# Patient Record
Sex: Female | Born: 1965 | Race: White | Hispanic: No | Smoking: Former smoker
Health system: Southern US, Community
[De-identification: ages and names within clinical notes are randomized; demographics above are authoritative.]

## PROBLEM LIST (undated history)

## (undated) DIAGNOSIS — M51369 Other intervertebral disc degeneration, lumbar region without mention of lumbar back pain or lower extremity pain: Secondary | ICD-10-CM

## (undated) DIAGNOSIS — M5136 Other intervertebral disc degeneration, lumbar region: Secondary | ICD-10-CM

## (undated) DIAGNOSIS — I10 Essential (primary) hypertension: Secondary | ICD-10-CM

## (undated) HISTORY — DX: Essential (primary) hypertension: I10

## (undated) HISTORY — DX: Other intervertebral disc degeneration, lumbar region without mention of lumbar back pain or lower extremity pain: M51.369

## (undated) HISTORY — DX: Other intervertebral disc degeneration, lumbar region: M51.36

---

## 1998-05-20 ENCOUNTER — Other Ambulatory Visit: Admission: RE | Admit: 1998-05-20 | Discharge: 1998-05-20 | Payer: Self-pay | Admitting: Obstetrics & Gynecology

## 1999-10-29 ENCOUNTER — Other Ambulatory Visit: Admission: RE | Admit: 1999-10-29 | Discharge: 1999-10-29 | Payer: Self-pay | Admitting: Obstetrics & Gynecology

## 2001-03-15 ENCOUNTER — Other Ambulatory Visit: Admission: RE | Admit: 2001-03-15 | Discharge: 2001-03-15 | Payer: Self-pay | Admitting: Obstetrics & Gynecology

## 2002-08-07 ENCOUNTER — Other Ambulatory Visit: Admission: RE | Admit: 2002-08-07 | Discharge: 2002-08-07 | Payer: Self-pay | Admitting: Obstetrics & Gynecology

## 2003-09-04 ENCOUNTER — Other Ambulatory Visit: Admission: RE | Admit: 2003-09-04 | Discharge: 2003-09-04 | Payer: Self-pay | Admitting: Obstetrics & Gynecology

## 2004-09-08 ENCOUNTER — Other Ambulatory Visit: Admission: RE | Admit: 2004-09-08 | Discharge: 2004-09-08 | Payer: Self-pay | Admitting: Obstetrics & Gynecology

## 2005-11-04 ENCOUNTER — Other Ambulatory Visit: Admission: RE | Admit: 2005-11-04 | Discharge: 2005-11-04 | Payer: Self-pay | Admitting: Obstetrics & Gynecology

## 2008-07-11 ENCOUNTER — Emergency Department (HOSPITAL_COMMUNITY): Admission: EM | Admit: 2008-07-11 | Discharge: 2008-07-11 | Payer: Self-pay | Admitting: Emergency Medicine

## 2010-07-04 ENCOUNTER — Ambulatory Visit (HOSPITAL_COMMUNITY): Admission: RE | Admit: 2010-07-04 | Discharge: 2010-07-04 | Payer: Self-pay | Admitting: Obstetrics & Gynecology

## 2011-02-28 LAB — CBC
HCT: 34.1 % — ABNORMAL LOW (ref 36.0–46.0)
Hemoglobin: 11.1 g/dL — ABNORMAL LOW (ref 12.0–15.0)
MCH: 26.4 pg (ref 26.0–34.0)
MCHC: 32.7 g/dL (ref 30.0–36.0)
MCV: 80.9 fL (ref 78.0–100.0)
RBC: 4.22 MIL/uL (ref 3.87–5.11)

## 2013-04-17 ENCOUNTER — Telehealth: Payer: Self-pay | Admitting: Physician Assistant

## 2013-04-17 MED ORDER — LISINOPRIL 10 MG PO TABS: 10.0000 mg | ORAL_TABLET | Freq: Every day | ORAL | Status: DC

## 2013-04-17 NOTE — Telephone Encounter (Signed)
Medication refilled per protocol.Patient needs to be seen before any further refills 

## 2013-05-22 ENCOUNTER — Ambulatory Visit (INDEPENDENT_AMBULATORY_CARE_PROVIDER_SITE_OTHER): Payer: BC Managed Care – PPO | Admitting: Family Medicine

## 2013-05-22 ENCOUNTER — Encounter: Payer: Self-pay | Admitting: Family Medicine

## 2013-05-22 VITALS — BP 130/78 | HR 74 | Temp 98.2°F | Ht 64.5 in | Wt 136.0 lb

## 2013-05-22 DIAGNOSIS — I10 Essential (primary) hypertension: Secondary | ICD-10-CM

## 2013-05-22 LAB — COMPLETE METABOLIC PANEL WITH GFR
Albumin: 4.7 g/dL (ref 3.5–5.2)
BUN: 12 mg/dL (ref 6–23)
CO2: 26 mEq/L (ref 19–32)
GFR, Est African American: >89 mL/min
GFR, Est Non African American: 83 mL/min
Glucose, Bld: 108 mg/dL — ABNORMAL HIGH (ref 70–99)
Sodium: 141 mEq/L (ref 135–145)
Total Bilirubin: 0.3 mg/dL (ref 0.3–1.2)
Total Protein: 7.4 g/dL (ref 6.0–8.3)

## 2013-05-22 LAB — LIPID PANEL
Cholesterol: 182 mg/dL (ref 0–200)
HDL: 63 mg/dL (ref >39)

## 2013-05-22 MED ORDER — PHENTERMINE HCL 37.5 MG PO CAPS: 37.5000 mg | ORAL_CAPSULE | ORAL | Status: DC

## 2013-05-22 NOTE — Progress Notes (Signed)
  Subjective:    Patient ID: Melissa Spencer, female    DOB: [birth year], 47 y.o.   MRN: [id number]  HPI Patient is here today for followup of her hypertension. She is currently taking Lisinopril 10 mg by mouth daily.  She denies any chest pain, shortness of breath, dyspnea on exertion. She has successfully quit smoking. She denies any daily cough the blood pressure medication. She is due today to recheck her cholesterol. She is also interested in medications for weight loss. She's never tried any type of medication for this. She is currently watching her diet. She does exercise but not regularly. She gets her regular GYN care from Dr. Arlyce Dice. Past Medical History  Diagnosis Date  . Hypertension    Current Outpatient Prescriptions on File Prior to Visit  Medication Sig Dispense Refill  . lisinopril (PRINIVIL,ZESTRIL) 10 MG tablet Take 1 tablet (10 mg total) by mouth daily.  90 tablet  0   No current facility-administered medications on file prior to visit.   No Known Allergies History   Social History  . Marital Status: Single    Spouse Name: N/A    Number of Children: N/A  . Years of Education: N/A   Occupational History  . Not on file.   Social History Main Topics  . Smoking status: Former Games developer  . Smokeless tobacco: Not on file  . Alcohol Use: Yes     Comment: Occasional  . Drug Use: No  . Sexually Active: Not on file   Other Topics Concern  . Not on file   Social History Narrative  . No narrative on file      Review of Systems  All other systems reviewed and are negative.       Objective:   Physical Exam  Vitals reviewed. Constitutional: She appears well-developed and well-nourished.  Cardiovascular: Normal rate, regular rhythm, normal heart sounds and intact distal pulses.  Exam reveals no gallop and no friction rub.   No murmur heard. Pulmonary/Chest: Effort normal and breath sounds normal. No respiratory distress. She has no wheezes. She has no rales.   Abdominal: Soft. Bowel sounds are normal. She exhibits no distension. There is no tenderness. There is no rebound.          Assessment & Plan:  1. HTN (hypertension) Blood pressure is currently well controlled. Continue current medication at current dose. I will check a fasting lipid panel to assess her LDL cholesterol as well as her triglycerides and she has a history of elevated triglycerides in the past.  The rest of her preventative care is performed by her gynecologist.  Regards to weight loss, we had a long discussion about the risk and benefits of medication. She elects to try and care mean 37.5 mg by mouth every morning.  I gave her 30 pills with 3 refills. We discussed cardiovascular risk with this medication. Also recommended increasing aerobic exercise to 30 minutes a day 5 days a week. - COMPLETE METABOLIC PANEL WITH GFR - Lipid panel

## 2013-05-30 ENCOUNTER — Other Ambulatory Visit (INDEPENDENT_AMBULATORY_CARE_PROVIDER_SITE_OTHER): Payer: BC Managed Care – PPO

## 2013-05-30 DIAGNOSIS — E875 Hyperkalemia: Secondary | ICD-10-CM

## 2013-05-31 LAB — BASIC METABOLIC PANEL
BUN: 9 mg/dL (ref 6–23)
CO2: 25 mEq/L (ref 19–32)
Calcium: 10.2 mg/dL (ref 8.4–10.5)
Glucose, Bld: 92 mg/dL (ref 70–99)
Potassium: 4.1 mEq/L (ref 3.5–5.3)
Sodium: 137 mEq/L (ref 135–145)

## 2013-06-01 ENCOUNTER — Other Ambulatory Visit: Payer: Self-pay | Admitting: Family Medicine

## 2013-06-01 MED ORDER — AMLODIPINE BESYLATE 5 MG PO TABS: 5.0000 mg | ORAL_TABLET | Freq: Every day | ORAL | Status: DC

## 2013-06-01 NOTE — Telephone Encounter (Signed)
Rx filled

## 2013-06-30 ENCOUNTER — Ambulatory Visit (INDEPENDENT_AMBULATORY_CARE_PROVIDER_SITE_OTHER): Payer: BC Managed Care – PPO | Admitting: Family Medicine

## 2013-06-30 ENCOUNTER — Encounter: Payer: Self-pay | Admitting: Family Medicine

## 2013-06-30 VITALS — BP 120/80 | HR 90 | Temp 98.0°F | Resp 18 | Wt 130.0 lb

## 2013-06-30 DIAGNOSIS — N390 Urinary tract infection, site not specified: Secondary | ICD-10-CM

## 2013-06-30 DIAGNOSIS — I10 Essential (primary) hypertension: Secondary | ICD-10-CM

## 2013-06-30 LAB — URINALYSIS, MICROSCOPIC ONLY: Crystals: NONE SEEN

## 2013-06-30 LAB — URINALYSIS, ROUTINE W REFLEX MICROSCOPIC
Bilirubin Urine: NEGATIVE
Leukocytes, UA: NEGATIVE
Nitrite: NEGATIVE
Specific Gravity, Urine: >1.03 — ABNORMAL HIGH (ref 1.005–1.030)
Urobilinogen, UA: 0.2 mg/dL (ref 0.0–1.0)

## 2013-06-30 MED ORDER — CIPROFLOXACIN HCL 500 MG PO TABS: 500.0000 mg | ORAL_TABLET | Freq: Two times a day (BID) | ORAL | Status: DC

## 2013-06-30 NOTE — Progress Notes (Signed)
Subjective:    Patient ID: Melissa Spencer, female    DOB: [birth year], 47 y.o.   MRN: [id number]  HPI 05/22/13 Patient is here today for followup of her hypertension. She is currently taking Lisinopril 10 mg by mouth daily.  She denies any chest pain, shortness of breath, dyspnea on exertion. She has successfully quit smoking. She denies any daily cough the blood pressure medication. She is due today to recheck her cholesterol. She is also interested in medications for weight loss. She's never tried any type of medication for this. She is currently watching her diet. She does exercise but not regularly. She gets her regular GYN care from Dr. Arlyce Dice.  At that time, my plan was: 1. HTN (hypertension) Blood pressure is currently well controlled. Continue current medication at current dose. I will check a fasting lipid panel to assess her LDL cholesterol as well as her triglycerides and she has a history of elevated triglycerides in the past.  The rest of her preventative care is performed by her gynecologist.  Regards to weight loss, we had a long discussion about the risk and benefits of medication. She elects to try and care mean 37.5 mg by mouth every morning.  I gave her 30 pills with 3 refills. We discussed cardiovascular risk with this medication. Also recommended increasing aerobic exercise to 30 minutes a day 5 days a week. - COMPLETE METABOLIC PANEL WITH GFR - Lipid panel Lab on 05/30/2013  Component Date Value Range Status  . Sodium 05/30/2013 137  135 - 145 mEq/L Final  . Potassium 05/30/2013 4.1  3.5 - 5.3 mEq/L Final  . Chloride 05/30/2013 100  96 - 112 mEq/L Final  . CO2 05/30/2013 25  19 - 32 mEq/L Final  . Glucose, Bld 05/30/2013 92  70 - 99 mg/dL Final  . BUN 40/98/1191 9  6 - 23 mg/dL Final  . Creat 47/82/9562 0.78  0.50 - 1.10 mg/dL Final  . Calcium 13/07/6577 10.2  8.4 - 10.5 mg/dL Final  Office Visit on 05/22/2013  Component Date Value Range Status  . Sodium 05/22/2013 141  135  - 145 mEq/L Final  . Potassium 05/22/2013 5.8* 3.5 - 5.3 mEq/L Final   No visible hemolysis.  . Chloride 05/22/2013 106  96 - 112 mEq/L Final  . CO2 05/22/2013 26  19 - 32 mEq/L Final  . Glucose, Bld 05/22/2013 108* 70 - 99 mg/dL Final  . BUN 46/96/2952 12  6 - 23 mg/dL Final  . Creat 84/13/2440 0.84  0.50 - 1.10 mg/dL Final  . Total Bilirubin 05/22/2013 0.3  0.3 - 1.2 mg/dL Final  . Alkaline Phosphatase 05/22/2013 64  39 - 117 U/L Final  . AST 05/22/2013 13  0 - 37 U/L Final  . ALT 05/22/2013 13  0 - 35 U/L Final  . Total Protein 05/22/2013 7.4  6.0 - 8.3 g/dL Final  . Albumin 10/10/2535 4.7  3.5 - 5.2 g/dL Final  . Calcium 64/40/3474 9.9  8.4 - 10.5 mg/dL Final  . GFR, Est African American 05/22/2013 >89   Final  . GFR, Est Non African American 05/22/2013 83   Final   Comment:                            The estimated GFR is a calculation valid for adults (>=4 years old)  that uses the CKD-EPI algorithm to adjust for age and sex. It is                            not to be used for children, pregnant women, hospitalized patients,                             patients on dialysis, or with rapidly changing kidney function.                          According to the NKDEP, eGFR >89 is normal, 60-89 shows mild                          impairment, 30-59 shows moderate impairment, 15-29 shows severe                          impairment and <15 is ESRD.                             Marland Kitchen Cholesterol 05/22/2013 182  0 - 200 mg/dL Final   Comment: ATP III Classification:                                < 200        mg/dL        Desirable                               200 - 239     mg/dL        Borderline High                               >= 240        mg/dL        High                             . Triglycerides 05/22/2013 188* <150 mg/dL Final  . HDL 16/09/9603 63  >39 mg/dL Final  . Total CHOL/HDL Ratio 05/22/2013 2.9   Final  . VLDL 05/22/2013 38  0 - 40 mg/dL Final  .  LDL Cholesterol 05/22/2013 81  0 - 99 mg/dL Final   Comment:                            Total Cholesterol/HDL Ratio:CHD Risk                                                 Coronary Heart Disease Risk Table  Men       Women                                   1/2 Average Risk              3.4        3.3                                       Average Risk              5.0        4.4                                    2X Average Risk              9.6        7.1                                    3X Average Risk             23.4       11.0                          Use the calculated Patient Ratio above and the CHD Risk table                           to determine the patient's CHD Risk.                          ATP III Classification (LDL):                                < 100        mg/dL         Optimal                               100 - 129     mg/dL         Near or Above Optimal                               130 - 159     mg/dL         Borderline High                               160 - 189     mg/dL         High                                > 190        mg/dL         Very High  Due to elevated potassium, lisinopril was discontinued and amlodipine 5 mg poqday was substituted.  The patient is here today for recheck.  Patient thinks she may have a UTI.  She reports 2 weeks of urinary frequency and bladder pressure. She states when she bleeds sometimes she voids very little and when she stand up she felt like she has to go immediately again.  She denies dysuria. She has noticed trace hematuria.  Her blood pressure has been well controlled on amlodipine. She denies any swelling in her ankles.  She only has one more month of amine and then she plans to quit. I want her that her BMI is approaching the lower limits of normal. Past Medical History  Diagnosis Date  . Hypertension    Current Outpatient  Prescriptions on File Prior to Visit  Medication Sig Dispense Refill  . amLODipine (NORVASC) 5 MG tablet Take 1 tablet (5 mg total) by mouth daily.  30 tablet  3  . phentermine 37.5 MG capsule Take 1 capsule (37.5 mg total) by mouth every morning.  30 capsule  2   No current facility-administered medications on file prior to visit.   No Known Allergies History   Social History  . Marital Status: Single    Spouse Name: N/A    Number of Children: N/A  . Years of Education: N/A   Occupational History  . Not on file.   Social History Main Topics  . Smoking status: Former Games developer  . Smokeless tobacco: Not on file  . Alcohol Use: Yes     Comment: Occasional  . Drug Use: No  . Sexually Active: Not on file   Other Topics Concern  . Not on file   Social History Narrative  . No narrative on file      Review of Systems  All other systems reviewed and are negative.       Objective:   Physical Exam  Vitals reviewed. Constitutional: She appears well-developed and well-nourished.  Cardiovascular: Normal rate, regular rhythm, normal heart sounds and intact distal pulses.  Exam reveals no gallop and no friction rub.   No murmur heard. Pulmonary/Chest: Effort normal and breath sounds normal. No respiratory distress. She has no wheezes. She has no rales.  Abdominal: Soft. Bowel sounds are normal. She exhibits no distension. There is no tenderness. There is no rebound.          Assessment & Plan:  1. UTI (urinary tract infection) Symptoms consistent with a urinary tract infection. Begin Cipro 500 mg by mouth twice a day for 5 days. - Urinalysis, Routine w reflex microscopic  2. HTN (hypertension) Blood pressures well controlled. Continue amlodipine 5 mg by mouth daily. Recheck in one year.

## 2013-06-30 NOTE — Addendum Note (Signed)
Addended by: WRAY, [place] on: 06/30/2013 09:48 AM   Modules accepted: Orders

## 2013-07-01 LAB — URINE CULTURE: Organism ID, Bacteria: NO GROWTH

## 2013-07-17 ENCOUNTER — Other Ambulatory Visit: Payer: BC Managed Care – PPO

## 2013-07-17 DIAGNOSIS — R3129 Other microscopic hematuria: Secondary | ICD-10-CM

## 2013-07-17 LAB — URINALYSIS, MICROSCOPIC ONLY: Casts: NONE SEEN

## 2013-07-17 LAB — URINALYSIS, ROUTINE W REFLEX MICROSCOPIC
Glucose, UA: NEGATIVE mg/dL
Leukocytes, UA: NEGATIVE
Nitrite: NEGATIVE
Specific Gravity, Urine: >1.03 — ABNORMAL HIGH (ref 1.005–1.030)
pH: 5.5 (ref 5.0–8.0)

## 2013-07-24 ENCOUNTER — Telehealth: Payer: Self-pay | Admitting: Family Medicine

## 2013-07-25 NOTE — Telephone Encounter (Signed)
lmtrc

## 2013-07-25 NOTE — Telephone Encounter (Signed)
Questions answered.

## 2013-07-28 ENCOUNTER — Encounter: Payer: Self-pay | Admitting: Family Medicine

## 2013-07-28 ENCOUNTER — Ambulatory Visit (INDEPENDENT_AMBULATORY_CARE_PROVIDER_SITE_OTHER): Payer: BC Managed Care – PPO | Admitting: Family Medicine

## 2013-07-28 VITALS — BP 128/82 | HR 78 | Temp 98.2°F | Resp 16 | Wt 128.0 lb

## 2013-07-28 DIAGNOSIS — R319 Hematuria, unspecified: Secondary | ICD-10-CM

## 2013-07-28 LAB — URINALYSIS, ROUTINE W REFLEX MICROSCOPIC
Bilirubin Urine: NEGATIVE
Glucose, UA: NEGATIVE mg/dL
Hgb urine dipstick: NEGATIVE
Leukocytes, UA: NEGATIVE
Protein, ur: NEGATIVE mg/dL
pH: 5.5 (ref 5.0–8.0)

## 2013-07-28 NOTE — Progress Notes (Signed)
Subjective:    Patient ID: Melissa Spencer, female    DOB: [birth year], 47 y.o.   MRN: [id number]  HPI Patient was initially seen July 18 with symptoms consistent with a urinary tract infection. She was found to have only microscopic hematuria on a urinalysis. Urine culture was negative. The symptoms did not improve on Cipro. A repeat urinalysis revealed again trace hematuria. She returns today. Although her urinalysis is clear she continues to complain of low back pain and pelvic pressure. She also complains of frequency and urgency. She is here to discuss the next step in the work up. Past Medical History  Diagnosis Date  . Hypertension    Current Outpatient Prescriptions on File Prior to Visit  Medication Sig Dispense Refill  . amLODipine (NORVASC) 5 MG tablet Take 1 tablet (5 mg total) by mouth daily.  30 tablet  3  . phentermine 37.5 MG capsule Take 1 capsule (37.5 mg total) by mouth every morning.  30 capsule  2   No current facility-administered medications on file prior to visit.   No Known Allergies History   Social History  . Marital Status: Single    Spouse Name: N/A    Number of Children: N/A  . Years of Education: N/A   Occupational History  . Not on file.   Social History Main Topics  . Smoking status: Former Games developer  . Smokeless tobacco: Not on file  . Alcohol Use: Yes     Comment: Occasional  . Drug Use: No  . Sexual Activity: Not on file   Other Topics Concern  . Not on file   Social History Narrative  . No narrative on file      Review of Systems  All other systems reviewed and are negative.       Objective:   Physical Exam  Vitals reviewed. Cardiovascular: Normal rate, regular rhythm and normal heart sounds.   No murmur heard. Pulmonary/Chest: Effort normal and breath sounds normal. No respiratory distress. She has no wheezes. She has no rales.  Abdominal: Soft. Bowel sounds are normal. She exhibits no distension and no mass. There is no  tenderness. There is no rebound and no guarding.   no CVAT  Office Visit on 07/28/2013  Component Date Value Range Status  . Color, Urine 07/28/2013 YELLOW  YELLOW Final  . APPearance 07/28/2013 CLEAR  CLEAR Final  . Specific Gravity, Urine 07/28/2013 >1.030* 1.005 - 1.030 Final  . pH 07/28/2013 5.5  5.0 - 8.0 Final  . Glucose, UA 07/28/2013 NEG  NEG mg/dL Final  . Bilirubin Urine 07/28/2013 NEG  NEG Final  . Ketones, ur 07/28/2013 NEG  NEG mg/dL Final  . Hgb urine dipstick 07/28/2013 NEG  NEG Final  . Protein, ur 07/28/2013 NEG  NEG mg/dL Final  . Urobilinogen, UA 07/28/2013 0.2  0.0 - 1.0 mg/dL Final  . Nitrite 40/98/1191 NEG  NEG Final  . Leukocytes, UA 07/28/2013 NEG  NEG Final         Assessment & Plan:  1. Hematuria Patient like to proceed with a CT scan of the abdomen and pelvis with contrast to evaluate for causes of hematuria. I feel the patient most likely had a small kidney stone that has subsequently passed. I feel her symptoms may be residual inflammation and swelling due to the passage of a kidney stone. I will obtain a CT scan to evaluate for any retained stones, rule out renal cyst or renal neoplasms. If symptoms persist consider a cystoscopy  through urology. - Urinalysis, Routine w reflex microscopic - CT Abdomen Pelvis W Contrast; Future

## 2013-08-01 ENCOUNTER — Other Ambulatory Visit: Payer: Self-pay | Admitting: Family Medicine

## 2013-08-01 DIAGNOSIS — R319 Hematuria, unspecified: Secondary | ICD-10-CM

## 2013-08-02 ENCOUNTER — Ambulatory Visit
Admission: RE | Admit: 2013-08-02 | Discharge: 2013-08-02 | Disposition: A | Discharge status: Home / Self Care | Payer: BC Managed Care – PPO | Source: Ambulatory Visit | Attending: Family Medicine | Admitting: Family Medicine

## 2013-08-02 DIAGNOSIS — R319 Hematuria, unspecified: Secondary | ICD-10-CM

## 2013-08-02 MED ORDER — IOHEXOL 300 MG/ML  SOLN
125.0000 mL | Freq: Once | INTRAMUSCULAR | Status: AC | PRN
  Administered 2013-08-02: 125 mL via INTRAVENOUS

## 2013-08-03 ENCOUNTER — Other Ambulatory Visit: Payer: Self-pay | Admitting: Family Medicine

## 2013-08-03 DIAGNOSIS — N852 Hypertrophy of uterus: Secondary | ICD-10-CM

## 2013-08-04 ENCOUNTER — Ambulatory Visit
Admission: RE | Admit: 2013-08-04 | Discharge: 2013-08-04 | Disposition: A | Discharge status: Home / Self Care | Payer: BC Managed Care – PPO | Source: Ambulatory Visit | Attending: Family Medicine | Admitting: Family Medicine

## 2013-08-04 ENCOUNTER — Telehealth: Payer: Self-pay | Admitting: Family Medicine

## 2013-08-04 ENCOUNTER — Other Ambulatory Visit: Payer: Self-pay | Admitting: Family Medicine

## 2013-08-04 DIAGNOSIS — N852 Hypertrophy of uterus: Secondary | ICD-10-CM

## 2013-08-04 NOTE — Telephone Encounter (Signed)
Does not think she is pregnant.  Can't understand why all the back pain and abd pain???   And the diarrhea?  Could it be caused by this fibroid?? States has pain every time she eats??

## 2013-08-04 NOTE — Telephone Encounter (Signed)
Message copied by Donne Anon on Fri Aug 04, 2013  5:43 PM ------      Message from: Lynnea Ferrier      Created: Fri Aug 04, 2013  1:51 PM       Good news, the [place] confirms fibroids.  There is a small cyst which is nonspecific in the uterus.  Is there any chance she could be pregnant?  If not, it is nothing dangerous but sometimes early pregnancies can look like that. ------

## 2013-08-07 NOTE — Telephone Encounter (Signed)
Patient aware.

## 2013-08-07 NOTE — Telephone Encounter (Signed)
Fibroids could cause lower abd pressure.  Hematuria has resolved and there were no abnormalities seen that would cause this.  She likely passed a small kidney stone.    Back pain is likely muscle strain in back.  Diarrhea is new complaint.  I honestly don't remember this.  It may be from Abx.  Try imodium prior to big meals and see if this stops diarrhea.

## 2013-09-26 ENCOUNTER — Other Ambulatory Visit: Payer: Self-pay | Admitting: Family Medicine

## 2014-01-22 ENCOUNTER — Other Ambulatory Visit: Payer: 59

## 2014-01-22 DIAGNOSIS — Z0000 Encounter for general adult medical examination without abnormal findings: Secondary | ICD-10-CM

## 2014-01-22 LAB — CBC WITH DIFFERENTIAL/PLATELET
BASOS PCT: 0 % (ref 0–1)
Basophils Absolute: 0 10*3/uL (ref 0.0–0.1)
EOS ABS: 0.1 10*3/uL (ref 0.0–0.7)
Eosinophils Relative: 1 % (ref 0–5)
HCT: 39.7 % (ref 36.0–46.0)
Hemoglobin: 13.4 g/dL (ref 12.0–15.0)
LYMPHS ABS: 2.7 10*3/uL (ref 0.7–4.0)
Lymphocytes Relative: 31 % (ref 12–46)
MCH: 30.4 pg (ref 26.0–34.0)
MCHC: 33.8 g/dL (ref 30.0–36.0)
MCV: 90 fL (ref 78.0–100.0)
Monocytes Absolute: 0.5 10*3/uL (ref 0.1–1.0)
Monocytes Relative: 6 % (ref 3–12)
NEUTROS PCT: 62 % (ref 43–77)
Neutro Abs: 5.4 10*3/uL (ref 1.7–7.7)
PLATELETS: 413 10*3/uL — AB (ref 150–400)
RBC: 4.41 MIL/uL (ref 3.87–5.11)
RDW: 12.9 % (ref 11.5–15.5)
WBC: 8.7 10*3/uL (ref 4.0–10.5)

## 2014-01-22 LAB — COMPREHENSIVE METABOLIC PANEL
ALK PHOS: 62 U/L (ref 39–117)
ALT: 18 U/L (ref 0–35)
AST: 15 U/L (ref 0–37)
Albumin: 4.5 g/dL (ref 3.5–5.2)
BILIRUBIN TOTAL: 0.5 mg/dL (ref 0.2–1.2)
BUN: 9 mg/dL (ref 6–23)
CO2: 29 mEq/L (ref 19–32)
Calcium: 9.8 mg/dL (ref 8.4–10.5)
Chloride: 101 mEq/L (ref 96–112)
Creat: 0.68 mg/dL (ref 0.50–1.10)
Glucose, Bld: 94 mg/dL (ref 70–99)
Potassium: 4.9 mEq/L (ref 3.5–5.3)
SODIUM: 138 meq/L (ref 135–145)
TOTAL PROTEIN: 7.2 g/dL (ref 6.0–8.3)

## 2014-01-22 LAB — TSH: TSH: 1.236 u[IU]/mL (ref 0.350–4.500)

## 2014-01-22 LAB — LIPID PANEL
Cholesterol: 190 mg/dL (ref 0–200)
HDL: 68 mg/dL (ref >39)
LDL CALC: 86 mg/dL (ref 0–99)
Total CHOL/HDL Ratio: 2.8 Ratio
Triglycerides: 179 mg/dL — ABNORMAL HIGH (ref <150)
VLDL: 36 mg/dL (ref 0–40)

## 2014-01-29 ENCOUNTER — Encounter: Payer: Self-pay | Admitting: Family Medicine

## 2014-01-29 ENCOUNTER — Ambulatory Visit (INDEPENDENT_AMBULATORY_CARE_PROVIDER_SITE_OTHER): Payer: 59 | Admitting: Family Medicine

## 2014-01-29 VITALS — BP 140/70 | HR 78 | Temp 97.8°F | Resp 18 | Ht 64.0 in | Wt 133.0 lb

## 2014-01-29 DIAGNOSIS — M5136 Other intervertebral disc degeneration, lumbar region: Secondary | ICD-10-CM | POA: Insufficient documentation

## 2014-01-29 DIAGNOSIS — Z0000 Encounter for general adult medical examination without abnormal findings: Secondary | ICD-10-CM

## 2014-01-29 NOTE — Progress Notes (Signed)
Subjective:    Patient ID: Melissa Spencer, female    DOB: 1966-05-02, 48 y.o.   MRN: [id number]  HPI Is a very pleasant 48 year old white female who presents today for complete physical exam. She sees Dr. Deatra Ina who provides her GYN care. She is scheduled to have a Pap smear and mammogram performed there later this year. She has no complaints today. He is doing well. Her blood pressure is borderline elevated today 140/70. Her most recent lab work is listed below: No visits with results within 1 Week(s) from this visit. Latest known visit with results is:  Lab on 01/22/2014  Component Date Value Ref Range Status  . WBC 01/22/2014 8.7  4.0 - 10.5 K/uL Final  . RBC 01/22/2014 4.41  3.87 - 5.11 MIL/uL Final  . Hemoglobin 01/22/2014 13.4  12.0 - 15.0 g/dL Final  . HCT 01/22/2014 39.7  36.0 - 46.0 % Final  . MCV 01/22/2014 90.0  78.0 - 100.0 fL Final  . MCH 01/22/2014 30.4  26.0 - 34.0 pg Final  . MCHC 01/22/2014 33.8  30.0 - 36.0 g/dL Final  . RDW 01/22/2014 12.9  11.5 - 15.5 % Final  . Platelets 01/22/2014 413* 150 - 400 K/uL Final  . Neutrophils Relative % 01/22/2014 62  43 - 77 % Final  . Neutro Abs 01/22/2014 5.4  1.7 - 7.7 K/uL Final  . Lymphocytes Relative 01/22/2014 31  12 - 46 % Final  . Lymphs Abs 01/22/2014 2.7  0.7 - 4.0 K/uL Final  . Monocytes Relative 01/22/2014 6  3 - 12 % Final  . Monocytes Absolute 01/22/2014 0.5  0.1 - 1.0 K/uL Final  . Eosinophils Relative 01/22/2014 1  0 - 5 % Final  . Eosinophils Absolute 01/22/2014 0.1  0.0 - 0.7 K/uL Final  . Basophils Relative 01/22/2014 0  0 - 1 % Final  . Basophils Absolute 01/22/2014 0.0  0.0 - 0.1 K/uL Final  . Smear Review 01/22/2014 Criteria for review not met   Final  . Sodium 01/22/2014 138  135 - 145 mEq/L Final  . Potassium 01/22/2014 4.9  3.5 - 5.3 mEq/L Final  . Chloride 01/22/2014 101  96 - 112 mEq/L Final  . CO2 01/22/2014 29  19 - 32 mEq/L Final  . Glucose, Bld 01/22/2014 94  70 - 99 mg/dL Final  . BUN 01/22/2014  9  6 - 23 mg/dL Final  . Creat 01/22/2014 0.68  0.50 - 1.10 mg/dL Final  . Total Bilirubin 01/22/2014 0.5  0.2 - 1.2 mg/dL Final   ** Please note change in reference range(s). **  . Alkaline Phosphatase 01/22/2014 62  39 - 117 U/L Final  . AST 01/22/2014 15  0 - 37 U/L Final  . ALT 01/22/2014 18  0 - 35 U/L Final  . Total Protein 01/22/2014 7.2  6.0 - 8.3 g/dL Final  . Albumin 01/22/2014 4.5  3.5 - 5.2 g/dL Final  . Calcium 01/22/2014 9.8  8.4 - 10.5 mg/dL Final  . Cholesterol 01/22/2014 190  0 - 200 mg/dL Final   Comment: ATP III Classification:                                < 200        mg/dL        Desirable  200 - 239     mg/dL        Borderline High                               >= 240        mg/dL        High                             . Triglycerides 01/22/2014 179* <150 mg/dL Final  . HDL 01/22/2014 68  >39 mg/dL Final  . Total CHOL/HDL Ratio 01/22/2014 2.8   Final  . VLDL 01/22/2014 36  0 - 40 mg/dL Final  . LDL Cholesterol 01/22/2014 86  0 - 99 mg/dL Final   Comment:                            Total Cholesterol/HDL Ratio:CHD Risk                                                 Coronary Heart Disease Risk Table                                                                 Men       Women                                   1/2 Average Risk              3.4        3.3                                       Average Risk              5.0        4.4                                    2X Average Risk              9.6        7.1                                    3X Average Risk             23.4       11.0                          Use the calculated Patient Ratio above and the CHD Risk table  to determine the patient's CHD Risk.                          ATP III Classification (LDL):                                < 100        mg/dL         Optimal                               100 - 129     mg/dL         Near or Above Optimal                                 130 - 159     mg/dL         Borderline High                               160 - 189     mg/dL         High                                > 190        mg/dL         Very High                             . TSH 01/22/2014 1.236  0.350 - 4.500 uIU/mL Final   Past Medical History  Diagnosis Date  . Hypertension   . 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 amLODipine (NORVASC) 5 MG tablet TAKE 1 TABLET (5 MG TOTAL) BY MOUTH DAILY.  30 tablet  3   No current facility-administered medications on file prior to visit.   No past surgical history on file. No Known Allergies History   Social History  . Marital Status: Single    Spouse Name: N/A    Number of Children: N/A  . Years of Education: N/A   Occupational History  . Not on file.   Social History Main Topics  . Smoking status: Former Research scientist (life sciences)  . Smokeless tobacco: Not on file  . Alcohol Use: Yes     Comment: Occasional  . Drug Use: No  . 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 Not on file   Social History Narrative  . No narrative on file   Family History  Problem Relation Age of Onset  . Hyperlipidemia Mother   . Hypertension Mother   . Hyperlipidemia Father   . Heart disease Father       Review of Systems  All other systems reviewed and are negative.       Objective:   Physical Exam  Vitals reviewed. Constitutional: She is oriented to person, place, and time. She appears well-developed and well-nourished. No distress.  HENT:  Head: Normocephalic  and atraumatic.  Right Ear: External ear normal.  Left Ear: External ear normal.  Nose: Nose normal.  Mouth/Throat: Oropharynx is clear and moist. No oropharyngeal exudate.  Eyes: Conjunctivae and EOM are normal. Pupils are equal, round, and  reactive to light. Right eye exhibits no discharge. Left eye exhibits no discharge. No scleral icterus.  Neck: Normal range of motion. Neck supple. No JVD present. No tracheal deviation present. No thyromegaly present.  Cardiovascular: Normal rate, regular rhythm, normal heart sounds and intact distal pulses.  Exam reveals no gallop and no friction rub.   No murmur heard. Pulmonary/Chest: Effort normal and breath sounds normal. No stridor. No respiratory distress. She has no wheezes. She has no rales. She exhibits no tenderness.  Abdominal: Soft. Bowel sounds are normal. She exhibits no distension and no mass. There is no tenderness. There is no rebound and no guarding.  Musculoskeletal: Normal range of motion. She exhibits no edema and no tenderness.  Lymphadenopathy:    She has no cervical adenopathy.  Neurological: She is alert and oriented to person, place, and time. She has normal reflexes. No cranial nerve deficit. Coordination normal.  Skin: Skin is warm. No rash noted. She is not diaphoretic. No erythema. No pallor.  Psychiatric: She has a normal mood and affect. Her behavior is normal. Judgment and thought content normal.   patient has a tattoo on the posterior right shoulder and on her right wrist.        Assessment & Plan:  1. Routine general medical examination at a health care facility Physical exam is completely normal except her blood pressure which is slightly elevated 140/70. Patient will check her blood pressure every day for the next 2 weeks and then bring me the values. Her blood pressures consistently higher than 140/90 I would recommend adding hydrochlorothiazide to the amlodipine that she is already taking. Patient declines a flu shot. Her tetanus shot  (TdaP)was given in 2009.  The remainder of her cancer screening will be performed her gynecologist this year.

## 2014-02-05 ENCOUNTER — Other Ambulatory Visit: Payer: Self-pay | Admitting: Family Medicine

## 2014-02-26 ENCOUNTER — Other Ambulatory Visit: Payer: Self-pay | Admitting: Obstetrics & Gynecology

## 2014-02-26 DIAGNOSIS — R928 Other abnormal and inconclusive findings on diagnostic imaging of breast: Secondary | ICD-10-CM

## 2014-03-02 ENCOUNTER — Ambulatory Visit
Admission: RE | Admit: 2014-03-02 | Discharge: 2014-03-02 | Disposition: A | Discharge status: Home / Self Care | Payer: Self-pay | Source: Ambulatory Visit | Attending: Obstetrics & Gynecology | Admitting: Obstetrics & Gynecology

## 2014-03-02 ENCOUNTER — Ambulatory Visit
Admission: RE | Admit: 2014-03-02 | Discharge: 2014-03-02 | Disposition: A | Discharge status: Home / Self Care | Payer: PRIVATE HEALTH INSURANCE | Source: Ambulatory Visit | Attending: Obstetrics & Gynecology | Admitting: Obstetrics & Gynecology

## 2014-03-02 DIAGNOSIS — R928 Other abnormal and inconclusive findings on diagnostic imaging of breast: Secondary | ICD-10-CM

## 2014-03-09 ENCOUNTER — Telehealth: Payer: Self-pay | Admitting: Family Medicine

## 2014-03-09 DIAGNOSIS — I10 Essential (primary) hypertension: Secondary | ICD-10-CM

## 2014-03-09 MED ORDER — HYDROCHLOROTHIAZIDE 12.5 MG PO CAPS: 12.5000 mg | ORAL_CAPSULE | Freq: Every day | ORAL | Status: DC

## 2014-03-09 NOTE — Telephone Encounter (Signed)
Dr Tanya NonesPickard reviewed BP readings left by patient.  Patient told, per provider, that readings are borderline most days and high on the other days.  Provider wants to start patient on HCTZ 12.5 mg QD.  Told patient to return to office in one month for nurse visit BP check.  She acknowledged understanding.

## 2014-04-10 ENCOUNTER — Other Ambulatory Visit: Payer: Self-pay | Admitting: Family Medicine

## 2014-05-06 ENCOUNTER — Other Ambulatory Visit: Payer: Self-pay | Admitting: Family Medicine

## 2014-11-08 ENCOUNTER — Other Ambulatory Visit: Payer: Self-pay | Admitting: Family Medicine

## 2014-11-22 ENCOUNTER — Other Ambulatory Visit: Payer: Self-pay | Admitting: *Deleted

## 2014-11-22 MED ORDER — HYDROCHLOROTHIAZIDE 12.5 MG PO CAPS: ORAL_CAPSULE | ORAL | Status: DC

## 2014-11-22 NOTE — Telephone Encounter (Signed)
Received fax requesting refill on HCTZ.  Medication filled x1 with no refills.   Requires office visit before any further refills can be given.   Letter sent.  

## 2014-12-25 ENCOUNTER — Other Ambulatory Visit: Payer: PRIVATE HEALTH INSURANCE

## 2014-12-25 DIAGNOSIS — Z79899 Other long term (current) drug therapy: Secondary | ICD-10-CM

## 2014-12-25 DIAGNOSIS — I10 Essential (primary) hypertension: Secondary | ICD-10-CM

## 2014-12-25 LAB — COMPLETE METABOLIC PANEL WITH GFR
ALK PHOS: 61 U/L (ref 39–117)
ALT: 14 U/L (ref 0–35)
AST: 14 U/L (ref 0–37)
Albumin: 4.2 g/dL (ref 3.5–5.2)
BILIRUBIN TOTAL: 0.4 mg/dL (ref 0.2–1.2)
BUN: 14 mg/dL (ref 6–23)
CALCIUM: 9.7 mg/dL (ref 8.4–10.5)
CO2: 26 meq/L (ref 19–32)
CREATININE: 0.64 mg/dL (ref 0.50–1.10)
Chloride: 102 mEq/L (ref 96–112)
GFR, Est African American: >89 mL/min
GFR, Est Non African American: >89 mL/min
Glucose, Bld: 97 mg/dL (ref 70–99)
POTASSIUM: 5 meq/L (ref 3.5–5.3)
SODIUM: 140 meq/L (ref 135–145)
Total Protein: 6.8 g/dL (ref 6.0–8.3)

## 2014-12-25 LAB — LIPID PANEL
CHOLESTEROL: 191 mg/dL (ref 0–200)
HDL: 67 mg/dL (ref >39)
LDL CALC: 97 mg/dL (ref 0–99)
TRIGLYCERIDES: 137 mg/dL (ref <150)
Total CHOL/HDL Ratio: 2.9 Ratio
VLDL: 27 mg/dL (ref 0–40)

## 2014-12-25 LAB — CBC WITH DIFFERENTIAL/PLATELET
Basophils Absolute: 0 10*3/uL (ref 0.0–0.1)
Basophils Relative: 0 % (ref 0–1)
EOS ABS: 0.2 10*3/uL (ref 0.0–0.7)
EOS PCT: 2 % (ref 0–5)
HEMATOCRIT: 39.1 % (ref 36.0–46.0)
HEMOGLOBIN: 13 g/dL (ref 12.0–15.0)
LYMPHS ABS: 2.6 10*3/uL (ref 0.7–4.0)
LYMPHS PCT: 32 % (ref 12–46)
MCH: 30.7 pg (ref 26.0–34.0)
MCHC: 33.2 g/dL (ref 30.0–36.0)
MCV: 92.2 fL (ref 78.0–100.0)
MONO ABS: 0.6 10*3/uL (ref 0.1–1.0)
MONOS PCT: 8 % (ref 3–12)
MPV: 9.8 fL (ref 8.6–12.4)
NEUTROS ABS: 4.6 10*3/uL (ref 1.7–7.7)
NEUTROS PCT: 58 % (ref 43–77)
Platelets: 399 10*3/uL (ref 150–400)
RBC: 4.24 MIL/uL (ref 3.87–5.11)
RDW: 13.4 % (ref 11.5–15.5)
WBC: 8 10*3/uL (ref 4.0–10.5)

## 2015-01-01 ENCOUNTER — Ambulatory Visit (INDEPENDENT_AMBULATORY_CARE_PROVIDER_SITE_OTHER): Payer: PRIVATE HEALTH INSURANCE | Admitting: Family Medicine

## 2015-01-01 ENCOUNTER — Encounter: Payer: Self-pay | Admitting: Family Medicine

## 2015-01-01 VITALS — BP 132/80 | HR 76 | Temp 98.2°F | Resp 18 | Wt 130.0 lb

## 2015-01-01 DIAGNOSIS — I10 Essential (primary) hypertension: Secondary | ICD-10-CM

## 2015-01-01 DIAGNOSIS — M7711 Lateral epicondylitis, right elbow: Secondary | ICD-10-CM

## 2015-01-01 NOTE — Progress Notes (Signed)
Subjective:    Patient ID: Melissa Spencer, female    DOB: October 23, 1966, 49 y.o.   MRN: [id number]  HPI Patient is a very pleasant 49 year old white female who is here today for follow-up of her hypertension. She denies any chest pain shortness of breath or dyspnea on exertion. Her blood pressures well controlled today at 132/80. She is having some pain in her right elbow over the right lateral epicondyles. The pain is worse with gripping objects and resisted wrist dorsiflexion Lab on 12/25/2014  Component Date Value Ref Range Status  . Sodium 12/25/2014 140  135 - 145 mEq/L Final  . Potassium 12/25/2014 5.0  3.5 - 5.3 mEq/L Final  . Chloride 12/25/2014 102  96 - 112 mEq/L Final  . CO2 12/25/2014 26  19 - 32 mEq/L Final  . Glucose, Bld 12/25/2014 97  70 - 99 mg/dL Final  . BUN 12/25/2014 14  6 - 23 mg/dL Final  . Creat 12/25/2014 0.64  0.50 - 1.10 mg/dL Final  . Total Bilirubin 12/25/2014 0.4  0.2 - 1.2 mg/dL Final  . Alkaline Phosphatase 12/25/2014 61  39 - 117 U/L Final  . AST 12/25/2014 14  0 - 37 U/L Final  . ALT 12/25/2014 14  0 - 35 U/L Final  . Total Protein 12/25/2014 6.8  6.0 - 8.3 g/dL Final  . Albumin 12/25/2014 4.2  3.5 - 5.2 g/dL Final  . Calcium 12/25/2014 9.7  8.4 - 10.5 mg/dL Final  . GFR, Est African American 12/25/2014 >89   Final  . GFR, Est Non African American 12/25/2014 >89   Final   Comment:   The estimated GFR is a calculation valid for adults (>=16 years old) that uses the CKD-EPI algorithm to adjust for age and sex. It is   not to be used for children, pregnant women, hospitalized patients,    patients on dialysis, or with rapidly changing kidney function. According to the NKDEP, eGFR >89 is normal, 60-89 shows mild impairment, 30-59 shows moderate impairment, 15-29 shows severe impairment and <15 is ESRD.     Marland Kitchen Cholesterol 12/25/2014 191  0 - 200 mg/dL Final   Comment: ATP III Classification:       < 200        mg/dL        Desirable      200 - 239      mg/dL        Borderline High      >= 240        mg/dL        High     . Triglycerides 12/25/2014 137  <150 mg/dL Final  . HDL 12/25/2014 67  >39 mg/dL Final  . Total CHOL/HDL Ratio 12/25/2014 2.9   Final  . VLDL 12/25/2014 27  0 - 40 mg/dL Final  . LDL Cholesterol 12/25/2014 97  0 - 99 mg/dL Final   Comment:   Total Cholesterol/HDL Ratio:CHD Risk                        Coronary Heart Disease Risk Table                                        Men       Women          1/2 Average Risk  3.4        3.3              Average Risk              5.0        4.4           2X Average Risk              9.6        7.1           3X Average Risk             23.4       11.0 Use the calculated Patient Ratio above and the CHD Risk table  to determine the patient's CHD Risk. ATP III Classification (LDL):       < 100        mg/dL         Optimal      100 - 129     mg/dL         Near or Above Optimal      130 - 159     mg/dL         Borderline High      160 - 189     mg/dL         High       > 190        mg/dL         Very High     . WBC 12/25/2014 8.0  4.0 - 10.5 K/uL Final  . RBC 12/25/2014 4.24  3.87 - 5.11 MIL/uL Final  . Hemoglobin 12/25/2014 13.0  12.0 - 15.0 g/dL Final  . HCT 12/25/2014 39.1  36.0 - 46.0 % Final  . MCV 12/25/2014 92.2  78.0 - 100.0 fL Final  . MCH 12/25/2014 30.7  26.0 - 34.0 pg Final  . MCHC 12/25/2014 33.2  30.0 - 36.0 g/dL Final  . RDW 12/25/2014 13.4  11.5 - 15.5 % Final  . Platelets 12/25/2014 399  150 - 400 K/uL Final  . MPV 12/25/2014 9.8  8.6 - 12.4 fL Final   ** Please note change in reference range(s). **  . Neutrophils Relative % 12/25/2014 58  43 - 77 % Final  . Neutro Abs 12/25/2014 4.6  1.7 - 7.7 K/uL Final  . Lymphocytes Relative 12/25/2014 32  12 - 46 % Final  . Lymphs Abs 12/25/2014 2.6  0.7 - 4.0 K/uL Final  . Monocytes Relative 12/25/2014 8  3 - 12 % Final  . Monocytes Absolute 12/25/2014 0.6  0.1 - 1.0 K/uL Final  . Eosinophils Relative  12/25/2014 2  0 - 5 % Final  . Eosinophils Absolute 12/25/2014 0.2  0.0 - 0.7 K/uL Final  . Basophils Relative 12/25/2014 0  0 - 1 % Final  . Basophils Absolute 12/25/2014 0.0  0.0 - 0.1 K/uL Final  . Smear Review 12/25/2014 Criteria for review not met   Final   Past Medical History  Diagnosis Date  . Hypertension   . 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 Current Outpatient Prescriptions on File Prior to Visit  Medication Sig Dispense Refill  . amLODipine (NORVASC) 5 MG tablet TAKE 1 TABLET EVERY DAY 30 tablet 11  . hydrochlorothiazide (MICROZIDE) 12.5 MG capsule TAKE 1 CAPSULE (12.5 MG TOTAL) BY MOUTH DAILY. 90 capsule 0  No current facility-administered medications on file prior to visit.   No Known Allergies History   Social History  . Marital Status: Single    Spouse Name: N/A    Number of Children: N/A  . Years of Education: N/A   Occupational History  . Not on file.   Social History Main Topics  . Smoking status: Former Research scientist (life sciences)  . Smokeless tobacco: Not on file  . Alcohol Use: Yes     Comment: Occasional  . Drug Use: No  . 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 Not on file   Social History Narrative   Family History  Problem Relation Age of Onset  . Hyperlipidemia Mother   . Hypertension Mother   . Hyperlipidemia Father   . Heart disease Father       Review of Systems  All other systems reviewed and are negative.      Objective:   Physical Exam  Constitutional: She appears well-developed and well-nourished.  Cardiovascular: Normal rate, regular rhythm, normal heart sounds and intact distal pulses.   No murmur heard. Pulmonary/Chest: Effort normal and breath sounds normal. No respiratory distress. She has no wheezes. She has no rales.  Abdominal: Soft. Bowel sounds are normal.  Musculoskeletal:       Right  elbow: Tenderness found. Lateral epicondyle tenderness noted.  Vitals reviewed.         Assessment & Plan:  Essential hypertension  Right lateral epicondylitis  Blood pressures well controlled and lab work is excellent. I will make no changes in her medication at this time. I did recommend a flu shot but the patient refused. I recommended an elbow strap for tennis elbow. If no better return for cortisone injection.

## 2015-01-15 ENCOUNTER — Other Ambulatory Visit: Payer: Self-pay | Admitting: Family Medicine

## 2015-02-18 ENCOUNTER — Encounter: Payer: Self-pay | Admitting: Family Medicine

## 2015-02-18 ENCOUNTER — Ambulatory Visit (INDEPENDENT_AMBULATORY_CARE_PROVIDER_SITE_OTHER): Payer: PRIVATE HEALTH INSURANCE | Admitting: Family Medicine

## 2015-02-18 VITALS — BP 128/78 | HR 78 | Temp 98.9°F | Resp 18 | Ht 64.0 in | Wt 126.0 lb

## 2015-02-18 DIAGNOSIS — J019 Acute sinusitis, unspecified: Secondary | ICD-10-CM | POA: Diagnosis not present

## 2015-02-18 MED ORDER — AMOXICILLIN-POT CLAVULANATE 875-125 MG PO TABS
1.0000 | ORAL_TABLET | Freq: Two times a day (BID) | ORAL | Status: DC
Start: 2015-02-18 — End: 2015-02-18

## 2015-02-18 MED ORDER — AMOXICILLIN-POT CLAVULANATE 875-125 MG PO TABS: 1.0000 | ORAL_TABLET | Freq: Two times a day (BID) | ORAL | Status: DC

## 2015-02-18 NOTE — Progress Notes (Signed)
   Subjective:    Patient ID: Melissa Spencer, female    DOB: 1966/09/15, 49 y.o.   MRN: [id number]  HPI Patient presents with 4-5 days of severe congestion in her head, pressure in her head, postnasal drip with a sore throat. Her upper teeth are hurting bilaterally. She has pain and pressure in both maxillary sinuses. She reports dizziness and blurry vision. She reports subjective fevers and cough. Past Medical History  Diagnosis Date  . Hypertension   . 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 Current Outpatient Prescriptions on File Prior to Visit  Medication Sig Dispense Refill  . amLODipine (NORVASC) 5 MG tablet TAKE 1 TABLET EVERY DAY 30 tablet 11  . hydrochlorothiazide (MICROZIDE) 12.5 MG capsule Take 1 capsule by mouth  daily 90 capsule 3   No current facility-administered medications on file prior to visit.   No Known Allergies History   Social History  . Marital Status: Single    Spouse Name: N/A  . Number of Children: N/A  . Years of Education: N/A   Occupational History  . Not on file.   Social History Main Topics  . Smoking status: Former Games developermoker  . Smokeless tobacco: Not on file  . Alcohol Use: Yes     Comment: Occasional  . Drug Use: No  . 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 Not on file   Social History Narrative      Review of Systems  All other systems reviewed and are negative.      Objective:   Physical Exam  Constitutional: She appears well-developed and well-nourished.  HENT:  Right Ear: Tympanic membrane, external ear and ear canal normal.  Left Ear: Tympanic membrane, external ear and ear canal normal.  Nose: Mucosal edema and rhinorrhea present. Right sinus exhibits frontal sinus tenderness. Right sinus exhibits no maxillary sinus tenderness. Left sinus exhibits frontal sinus tenderness. Left sinus  exhibits no maxillary sinus tenderness.  Mouth/Throat: Oropharynx is clear and moist. No oropharyngeal exudate.  Neck: Neck supple.  Cardiovascular: Normal rate, regular rhythm and normal heart sounds.   Pulmonary/Chest: Effort normal and breath sounds normal.  Lymphadenopathy:    She has no cervical adenopathy.  Vitals reviewed.         Assessment & Plan:  Acute rhinosinusitis - Plan: amoxicillin-clavulanate (AUGMENTIN) 875-125 MG per tablet, DISCONTINUED: amoxicillin-clavulanate (AUGMENTIN) 875-125 MG per tablet  Begin Augmentin 875 mg by mouth twice a day for 10 days. Monitor for the development of thrush as the patient does have some mild white plaque on her tongue

## 2015-04-12 ENCOUNTER — Other Ambulatory Visit: Payer: Self-pay | Admitting: Family Medicine

## 2015-04-12 NOTE — Telephone Encounter (Signed)
Refill appropriate and filled per protocol. 

## 2015-04-26 ENCOUNTER — Ambulatory Visit (INDEPENDENT_AMBULATORY_CARE_PROVIDER_SITE_OTHER): Payer: PRIVATE HEALTH INSURANCE | Admitting: Family Medicine

## 2015-04-26 ENCOUNTER — Encounter: Payer: Self-pay | Admitting: Family Medicine

## 2015-04-26 VITALS — BP 142/78 | HR 104 | Temp 102.8°F | Resp 18 | Ht 60.5 in | Wt 130.0 lb

## 2015-04-26 DIAGNOSIS — R509 Fever, unspecified: Secondary | ICD-10-CM

## 2015-04-26 LAB — CBC WITH DIFFERENTIAL/PLATELET
BASOS PCT: 0 % (ref 0–1)
Basophils Absolute: 0 10*3/uL (ref 0.0–0.1)
EOS PCT: 0 % (ref 0–5)
Eosinophils Absolute: 0 10*3/uL (ref 0.0–0.7)
HCT: 35.7 % — ABNORMAL LOW (ref 36.0–46.0)
HEMOGLOBIN: 12.6 g/dL (ref 12.0–15.0)
LYMPHS PCT: 4 % — AB (ref 12–46)
Lymphs Abs: 0.6 10*3/uL — ABNORMAL LOW (ref 0.7–4.0)
MCH: 31.7 pg (ref 26.0–34.0)
MCHC: 35.3 g/dL (ref 30.0–36.0)
MCV: 89.9 fL (ref 78.0–100.0)
MONOS PCT: 5 % (ref 3–12)
MPV: 10.3 fL (ref 8.6–12.4)
Monocytes Absolute: 0.8 10*3/uL (ref 0.1–1.0)
NEUTROS ABS: 14 10*3/uL — AB (ref 1.7–7.7)
Neutrophils Relative %: 91 % — ABNORMAL HIGH (ref 43–77)
Platelets: 297 10*3/uL (ref 150–400)
RBC: 3.97 MIL/uL (ref 3.87–5.11)
RDW: 13.3 % (ref 11.5–15.5)
WBC: 15.4 10*3/uL — AB (ref 4.0–10.5)

## 2015-04-26 LAB — URINALYSIS, MICROSCOPIC ONLY
CRYSTALS: NONE SEEN
Casts: NONE SEEN

## 2015-04-26 LAB — INFLUENZA A AND B
INFLUENZA A AG: NEGATIVE
INFLUENZA B AG: NEGATIVE

## 2015-04-26 LAB — URINALYSIS, ROUTINE W REFLEX MICROSCOPIC
Bilirubin Urine: NEGATIVE
GLUCOSE, UA: NEGATIVE mg/dL
Ketones, ur: NEGATIVE mg/dL
Nitrite: NEGATIVE
PH: 6 (ref 5.0–8.0)
Protein, ur: 30 mg/dL — AB
Specific Gravity, Urine: >1.03 — ABNORMAL HIGH (ref 1.005–1.030)
UROBILINOGEN UA: 0.2 mg/dL (ref 0.0–1.0)

## 2015-04-26 LAB — COMPLETE METABOLIC PANEL WITH GFR
ALK PHOS: 78 U/L (ref 39–117)
ALT: 10 U/L (ref 0–35)
AST: 11 U/L (ref 0–37)
Albumin: 4.4 g/dL (ref 3.5–5.2)
BUN: 9 mg/dL (ref 6–23)
CHLORIDE: 96 meq/L (ref 96–112)
CO2: 24 meq/L (ref 19–32)
CREATININE: 0.67 mg/dL (ref 0.50–1.10)
Calcium: 9.2 mg/dL (ref 8.4–10.5)
GFR, Est African American: >89 mL/min
GFR, Est Non African American: >89 mL/min
Glucose, Bld: 114 mg/dL — ABNORMAL HIGH (ref 70–99)
Potassium: 3.6 mEq/L (ref 3.5–5.3)
SODIUM: 135 meq/L (ref 135–145)
Total Bilirubin: 0.8 mg/dL (ref 0.2–1.2)
Total Protein: 7.6 g/dL (ref 6.0–8.3)

## 2015-04-26 MED ORDER — PROMETHAZINE HCL 25 MG PO TABS: 25.0000 mg | ORAL_TABLET | Freq: Three times a day (TID) | ORAL | Status: DC | PRN

## 2015-04-26 MED ORDER — DOXYCYCLINE HYCLATE 100 MG PO TABS: 100.0000 mg | ORAL_TABLET | Freq: Two times a day (BID) | ORAL | Status: DC

## 2015-04-26 NOTE — Addendum Note (Signed)
Addended by: Legrand RamsWILLIS, Tennelle Taflinger B on: 04/26/2015 12:23 PM   Modules accepted: Orders

## 2015-04-26 NOTE — Progress Notes (Signed)
Subjective:    Patient ID: Melissa Spencer, female    DOB: [birth year], 49 y.o.   MRN: [id number]  HPI Patient symptoms developed yesterday. Symptoms include a fever to 102.8, diffuse myalgias, body aches, nausea, chills, and fatigue. She denies any sore throat, cough, otalgia, or sinus pain. She denies any diarrhea. She is having some crampy lower abdominal pain that she is also on her menstrual cycle. She denies any vaginal discharge. She is confident that she is not pregnant. She denies any exposure to gonorrhea or chlamydia. She does have a red macular rash on her left shin. She denies any tick bites although she has been working in the yard more recently. She has no right upper quadrant pain. She has no right lower quadrant abdominal pain. Urinalysis today is very nonspecific. It does show blood consistent with her stress cycle. It also shows trace leukocyte esterase which could be a contaminant. She denies any diarrhea Past Medical History  Diagnosis Date  . Hypertension   . 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 Current Outpatient Prescriptions on File Prior to Visit  Medication Sig Dispense Refill  . amLODipine (NORVASC) 5 MG tablet TAKE 1 TABLET BY MOUTH EVERY DAY 30 tablet 3  . hydrochlorothiazide (MICROZIDE) 12.5 MG capsule Take 1 capsule by mouth  daily 90 capsule 3   No current facility-administered medications on file prior to visit.   No Known Allergies History   Social History  . Marital Status: Single    Spouse Name: N/A  . Number of Children: N/A  . Years of Education: N/A   Occupational History  . Not on file.   Social History Main Topics  . Smoking status: Former Games developermoker  . Smokeless tobacco: Not on file  . Alcohol Use: Yes     Comment: Occasional  . Drug Use: No  . 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 Not on file    Social History Narrative      Review of Systems  All other systems reviewed and are negative.      Objective:   Physical Exam  Constitutional: She is oriented to person, place, and time. She appears well-developed and well-nourished. No distress.  HENT:  Head: Normocephalic and atraumatic.  Right Ear: External ear normal.  Left Ear: External ear normal.  Nose: Nose normal.  Mouth/Throat: Oropharynx is clear and moist. No oropharyngeal exudate.  Eyes: Conjunctivae and EOM are normal. Pupils are equal, round, and reactive to light. Right eye exhibits no discharge. Left eye exhibits no discharge. No scleral icterus.  Neck: Normal range of motion. Neck supple.  Cardiovascular: Normal rate, regular rhythm and normal heart sounds.   No murmur heard. Pulmonary/Chest: Effort normal and breath sounds normal. No respiratory distress. She has no wheezes. She has no rales. She exhibits no tenderness.  Abdominal: Soft. Bowel sounds are normal. She exhibits no distension and no mass. There is no tenderness. There is no rebound and no guarding.  Musculoskeletal: Normal range of motion. She exhibits no edema or tenderness.  Lymphadenopathy:    She has no cervical adenopathy.  Neurological: She is alert and oriented to person, place, and time. She has normal reflexes. She exhibits normal muscle tone. Coordination normal.  Skin: Rash noted. She is not diaphoretic. No erythema.  Vitals reviewed.  Assessment & Plan:  Other specified fever - Plan: Influenza a and b, doxycycline (VIBRA-TABS) 100 MG tablet, CBC with Differential/Platelet, COMPLETE METABOLIC PANEL WITH GFR, B. burgdorfi antibodies by WB, [place] spotted fvr abs pnl(IgG+IgM), promethazine (PHENERGAN) 25 MG tablet  Patient symptoms are more consistent with a viral syndrome. Her flu test is negative today. I anticipate gradual improvement over the next 3-4 days. I recommended symptomatic treatment including ibuprofen 800 mg  every 8 hours for fever and myalgias. I will give her Phenergan 25 mg every 8 hours for nausea. I recommended rest and that she push fluids. Given the vague rash on her left shin, the time of year, and her flu like symptoms, I will also start the patient empirically on doxycycline 100 mg by mouth twice a day for 10 days for possible exposure to [facility] fever while I am awaiting the results of the blood work. Recheck next week or seek medical attention immediately if worse

## 2015-04-27 LAB — URINE CULTURE

## 2015-04-29 LAB — ROCKY MTN SPOTTED FVR ABS PNL(IGG+IGM)
RMSF IGM: 0.21 IV
RMSF IgG: 2.96 IV — ABNORMAL HIGH

## 2015-04-30 ENCOUNTER — Telehealth: Payer: Self-pay | Admitting: Family Medicine

## 2015-04-30 NOTE — Telephone Encounter (Signed)
Patient aware of results.

## 2015-04-30 NOTE — Telephone Encounter (Signed)
Pt has called back again and left a vm with the same phone number

## 2015-04-30 NOTE — Telephone Encounter (Signed)
[phone] PT is rtn your call about some results

## 2015-05-01 LAB — B. BURGDORFI ANTIBODIES BY WB
B burgdorferi IgG Abs (IB): NEGATIVE
B burgdorferi IgM Abs (IB): NEGATIVE

## 2015-05-02 ENCOUNTER — Ambulatory Visit (INDEPENDENT_AMBULATORY_CARE_PROVIDER_SITE_OTHER): Payer: PRIVATE HEALTH INSURANCE | Admitting: Family Medicine

## 2015-05-02 ENCOUNTER — Encounter: Payer: Self-pay | Admitting: Family Medicine

## 2015-05-02 VITALS — BP 130/80 | HR 98 | Temp 98.5°F | Resp 16 | Ht 60.5 in | Wt 130.0 lb

## 2015-05-02 DIAGNOSIS — D72829 Elevated white blood cell count, unspecified: Secondary | ICD-10-CM | POA: Diagnosis not present

## 2015-05-02 LAB — CBC WITH DIFFERENTIAL/PLATELET
BASOS ABS: 0.1 10*3/uL (ref 0.0–0.1)
BASOS PCT: 1 % (ref 0–1)
Eosinophils Absolute: 0.2 10*3/uL (ref 0.0–0.7)
Eosinophils Relative: 2 % (ref 0–5)
HEMATOCRIT: 39.7 % (ref 36.0–46.0)
Hemoglobin: 13.4 g/dL (ref 12.0–15.0)
LYMPHS PCT: 41 % (ref 12–46)
Lymphs Abs: 3.7 10*3/uL (ref 0.7–4.0)
MCH: 31.1 pg (ref 26.0–34.0)
MCHC: 33.8 g/dL (ref 30.0–36.0)
MCV: 92.1 fL (ref 78.0–100.0)
MONOS PCT: 8 % (ref 3–12)
MPV: 9.7 fL (ref 8.6–12.4)
Monocytes Absolute: 0.7 10*3/uL (ref 0.1–1.0)
NEUTROS ABS: 4.3 10*3/uL (ref 1.7–7.7)
Neutrophils Relative %: 48 % (ref 43–77)
Platelets: 414 10*3/uL — ABNORMAL HIGH (ref 150–400)
RBC: 4.31 MIL/uL (ref 3.87–5.11)
RDW: 13.4 % (ref 11.5–15.5)
WBC: 9 10*3/uL (ref 4.0–10.5)

## 2015-05-02 NOTE — Progress Notes (Signed)
Subjective:    Patient ID: Melissa Spencer, female    DOB: 04/12/1966, 49 y.o.   MRN: [id number]  HPI 04/26/15 Patient symptoms developed yesterday. Symptoms include a fever to 102.8, diffuse myalgias, body aches, nausea, chills, and fatigue. She denies any sore throat, cough, otalgia, or sinus pain. She denies any diarrhea. She is having some crampy lower abdominal pain that she is also on her menstrual cycle. She denies any vaginal discharge. She is confident that she is not pregnant. She denies any exposure to gonorrhea or chlamydia. She does have a red macular rash on her left shin. She denies any tick bites although she has been working in the yard more recently. She has no right upper quadrant pain. She has no right lower quadrant abdominal pain. Urinalysis today is very nonspecific. It does show blood consistent with her stress cycle. It also shows trace leukocyte esterase which could be a contaminant. She denies any diarrhea.  At that time, my plan was:  Patient symptoms are more consistent with a viral syndrome. Her flu test is negative today. I anticipate gradual improvement over the next 3-4 days. I recommended symptomatic treatment including ibuprofen 800 mg every 8 hours for fever and myalgias. I will give her Phenergan 25 mg every 8 hours for nausea. I recommended rest and that she push fluids. Given the vague rash on her left shin, the time of year, and her flu like symptoms, I will also start the patient empirically on doxycycline 100 mg by mouth twice a day for 10 days for possible exposure to [facility] spotted fever while I am awaiting the results of the blood work. Recheck next week or seek medical attention immediately if worse  05/02/15 Patient symptomatically feels much better. Her fevers have gone away. Her abdominal pain has gone away. Unfortunately on lab work, her white blood cell count was elevated. She also had positive antibody titers for [facility] spotted fever which  were IgG. I feel these are likely false positives as I believe it would take longer for the IgG titers to become positive given the fact she only had symptoms for a few days. In any respect, clinically the patient is improving Past Medical History  Diagnosis Date  . Hypertension   . 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 Current Outpatient Prescriptions on File Prior to Visit  Medication Sig Dispense Refill  . amLODipine (NORVASC) 5 MG tablet TAKE 1 TABLET BY MOUTH EVERY DAY 30 tablet 3  . doxycycline (VIBRA-TABS) 100 MG tablet Take 1 tablet (100 mg total) by mouth 2 (two) times daily. 20 tablet 0  . hydrochlorothiazide (MICROZIDE) 12.5 MG capsule Take 1 capsule by mouth  daily 90 capsule 3  . promethazine (PHENERGAN) 25 MG tablet Take 1 tablet (25 mg total) by mouth every 8 (eight) hours as needed for nausea or vomiting. 20 tablet 0   No current facility-administered medications on file prior to visit.   No Known Allergies History   Social History  . Marital Status: Single    Spouse Name: N/A  . Number of Children: N/A  . Years of Education: N/A   Occupational History  . Not on file.   Social History Main Topics  . Smoking status: Former Games developermoker  . Smokeless tobacco: Not on file  . Alcohol Use: Yes     Comment: Occasional  . Drug Use: No  .  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 Not on file   Social History Narrative      Review of Systems  All other systems reviewed and are negative.      Objective:   Physical Exam  Constitutional: She is oriented to person, place, and time. She appears well-developed and well-nourished. No distress.  HENT:  Head: Normocephalic and atraumatic.  Right Ear: External ear normal.  Left Ear: External ear normal.  Nose: Nose normal.  Mouth/Throat: Oropharynx is clear and moist. No oropharyngeal  exudate.  Eyes: Conjunctivae and EOM are normal. Pupils are equal, round, and reactive to light. Right eye exhibits no discharge. Left eye exhibits no discharge. No scleral icterus.  Neck: Normal range of motion. Neck supple.  Cardiovascular: Normal rate, regular rhythm and normal heart sounds.   No murmur heard. Pulmonary/Chest: Effort normal and breath sounds normal. No respiratory distress. She has no wheezes. She has no rales. She exhibits no tenderness.  Abdominal: Soft. Bowel sounds are normal. She exhibits no distension and no mass. There is no tenderness. There is no rebound and no guarding.  Musculoskeletal: Normal range of motion. She exhibits no edema or tenderness.  Lymphadenopathy:    She has no cervical adenopathy.  Neurological: She is alert and oriented to person, place, and time. She has normal reflexes. She exhibits normal muscle tone. Coordination normal.  Skin: Rash noted. She is not diaphoretic. No erythema.  Vitals reviewed.         Assessment & Plan:  Leukocytosis - Plan: CBC with Differential/Platelet  I would like to recheck a white blood cell count. If the white blood cell count is normal at this point no further workup is necessary. Clinically the patient is better. I do believe that this was a false positive for [facility] fever but I did ask the patient to complete the doxycycline since she is clinically improving

## 2015-05-03 ENCOUNTER — Telehealth: Payer: Self-pay | Admitting: Family Medicine

## 2015-05-03 NOTE — Telephone Encounter (Signed)
Patient calling for test results [phone]

## 2015-05-03 NOTE — Telephone Encounter (Signed)
Patient aware of results.

## 2015-08-27 ENCOUNTER — Other Ambulatory Visit: Payer: Self-pay | Admitting: Family Medicine

## 2016-01-15 ENCOUNTER — Encounter: Payer: Self-pay | Admitting: Family Medicine

## 2016-01-15 ENCOUNTER — Other Ambulatory Visit: Payer: Self-pay | Admitting: Family Medicine

## 2016-01-15 NOTE — Telephone Encounter (Signed)
Medication refill for one time only.  Patient needs to be seen.  Letter sent for patient to call and schedule 

## 2016-03-06 ENCOUNTER — Other Ambulatory Visit: Payer: Self-pay | Admitting: Obstetrics & Gynecology

## 2016-03-06 DIAGNOSIS — R928 Other abnormal and inconclusive findings on diagnostic imaging of breast: Secondary | ICD-10-CM

## 2016-03-16 ENCOUNTER — Other Ambulatory Visit: Payer: Self-pay | Admitting: Family Medicine

## 2016-03-17 ENCOUNTER — Ambulatory Visit
Admission: RE | Admit: 2016-03-17 | Discharge: 2016-03-17 | Disposition: A | Discharge status: Home / Self Care | Payer: PRIVATE HEALTH INSURANCE | Source: Ambulatory Visit | Attending: Obstetrics & Gynecology | Admitting: Obstetrics & Gynecology

## 2016-03-17 DIAGNOSIS — R928 Other abnormal and inconclusive findings on diagnostic imaging of breast: Secondary | ICD-10-CM

## 2016-03-17 NOTE — Telephone Encounter (Signed)
Refill appropriate and filled per protocol. 

## 2016-03-30 ENCOUNTER — Encounter: Payer: Self-pay | Admitting: Family Medicine

## 2016-03-30 ENCOUNTER — Ambulatory Visit (INDEPENDENT_AMBULATORY_CARE_PROVIDER_SITE_OTHER): Payer: PRIVATE HEALTH INSURANCE | Admitting: Family Medicine

## 2016-03-30 VITALS — BP 130/90 | HR 72 | Temp 98.0°F | Resp 16 | Ht 60.5 in | Wt 132.0 lb

## 2016-03-30 DIAGNOSIS — Z0000 Encounter for general adult medical examination without abnormal findings: Secondary | ICD-10-CM

## 2016-03-30 DIAGNOSIS — I10 Essential (primary) hypertension: Secondary | ICD-10-CM | POA: Diagnosis not present

## 2016-03-30 LAB — CBC WITH DIFFERENTIAL/PLATELET
BASOS ABS: 0 {cells}/uL (ref 0–200)
Basophils Relative: 0 %
EOS ABS: 306 {cells}/uL (ref 15–500)
EOS PCT: 3 %
HCT: 41.4 % (ref 35.0–45.0)
Hemoglobin: 13.8 g/dL (ref 12.0–15.0)
Lymphocytes Relative: 27 %
Lymphs Abs: 2754 cells/uL (ref 850–3900)
MCH: 30.8 pg (ref 27.0–33.0)
MCHC: 33.3 g/dL (ref 32.0–36.0)
MCV: 92.4 fL (ref 80.0–100.0)
MONOS PCT: 6 %
MPV: 9.7 fL (ref 7.5–12.5)
Monocytes Absolute: 612 cells/uL (ref 200–950)
NEUTROS ABS: 6528 {cells}/uL (ref 1500–7800)
NEUTROS PCT: 64 %
PLATELETS: 334 10*3/uL (ref 140–400)
RBC: 4.48 MIL/uL (ref 3.80–5.10)
RDW: 12.6 % (ref 11.0–15.0)
WBC: 10.2 10*3/uL (ref 3.8–10.8)

## 2016-03-30 LAB — LIPID PANEL
CHOL/HDL RATIO: 3.4 ratio (ref <=5.0)
CHOLESTEROL: 211 mg/dL — AB (ref 125–200)
HDL: 62 mg/dL (ref >=46)
LDL CALC: 93 mg/dL (ref <130)
TRIGLYCERIDES: 282 mg/dL — AB (ref <150)
VLDL: 56 mg/dL — AB (ref <30)

## 2016-03-30 LAB — COMPLETE METABOLIC PANEL WITH GFR
ALBUMIN: 4.5 g/dL (ref 3.6–5.1)
ALT: 14 U/L (ref 6–29)
AST: 13 U/L (ref 10–35)
Alkaline Phosphatase: 66 U/L (ref 33–130)
BUN: 11 mg/dL (ref 7–25)
CO2: 27 mmol/L (ref 20–31)
Calcium: 9.6 mg/dL (ref 8.6–10.4)
Chloride: 99 mmol/L (ref 98–110)
Creat: 0.81 mg/dL (ref 0.50–1.05)
GFR, EST NON AFRICAN AMERICAN: 85 mL/min (ref >=60)
Glucose, Bld: 94 mg/dL (ref 70–99)
POTASSIUM: 4.4 mmol/L (ref 3.5–5.3)
SODIUM: 137 mmol/L (ref 135–146)
TOTAL PROTEIN: 7.3 g/dL (ref 6.1–8.1)
Total Bilirubin: 0.4 mg/dL (ref 0.2–1.2)

## 2016-03-30 NOTE — Progress Notes (Signed)
Subjective:    Patient ID: Melissa Spencer, female    DOB: October 29, 1966, 50 y.o.   MRN: [id number]  HPI Patient is a very pleasant 50 year old white female who presents today for a general medical exam. She sees a gynecologist who performs her Pap smear. Her last mammogram was performed in early April. There was benign calcifications and they recommended a repeat mammogram in 6 months. She is due for colonoscopy. Tetanus shot is up-to-date. She politely declines a flu shot. Her review of systems is negative. However her blood pressure today is borderline high at 130/90. She does state she is a little nervous having her blood drawn Past Medical History  Diagnosis Date  . Hypertension   . 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 Current Outpatient Prescriptions on File Prior to Visit  Medication Sig Dispense Refill  . amLODipine (NORVASC) 5 MG tablet TAKE 1 TABLET BY MOUTH EVERY DAY 30 tablet 1  . hydrochlorothiazide (MICROZIDE) 12.5 MG capsule Take 1 capsule by mouth  daily 90 capsule 0   No current facility-administered medications on file prior to visit.   No Known Allergies Social History   Social History  . Marital Status: Single    Spouse Name: N/A  . Number of Children: N/A  . Years of Education: N/A   Occupational History  . Not on file.   Social History Main Topics  . Smoking status: Former Games developer  . Smokeless tobacco: Not on file  . Alcohol Use: Yes     Comment: Occasional  . Drug Use: No  . 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 Not on file   Social History Narrative   Family History  Problem Relation Age of Onset  . Hyperlipidemia Mother   . Hypertension Mother   . Hyperlipidemia Father   . Heart disease Father       Review of Systems  All other systems reviewed and are negative.      Objective:   Physical Exam    Constitutional: She is oriented to person, place, and time. She appears well-developed and well-nourished. No distress.  HENT:  Head: Normocephalic and atraumatic.  Right Ear: External ear normal.  Left Ear: External ear normal.  Nose: Nose normal.  Mouth/Throat: Oropharynx is clear and moist. No oropharyngeal exudate.  Eyes: Conjunctivae and EOM are normal. Pupils are equal, round, and reactive to light. Right eye exhibits no discharge. Left eye exhibits no discharge. No scleral icterus.  Neck: Normal range of motion. Neck supple. No JVD present. No tracheal deviation present. No thyromegaly present.  Cardiovascular: Normal rate, regular rhythm, normal heart sounds and intact distal pulses.  Exam reveals no gallop and no friction rub.   No murmur heard. Pulmonary/Chest: Effort normal and breath sounds normal. No stridor. No respiratory distress. She has no wheezes. She has no rales. She exhibits no tenderness.  Abdominal: Soft. Bowel sounds are normal. She exhibits no distension and no mass. There is no tenderness. There is no rebound and no guarding.  Musculoskeletal: Normal range of motion. She exhibits no edema or tenderness.  Lymphadenopathy:    She has no cervical adenopathy.  Neurological: She is alert and oriented to person, place, and time. She has normal reflexes. She displays normal reflexes. No cranial nerve deficit. She exhibits normal muscle tone. Coordination normal.  Skin: Skin is warm. No rash noted. She is not diaphoretic. No erythema. No pallor.  Psychiatric: She has a normal mood and affect. Her behavior is normal. Judgment and thought content normal.  Vitals reviewed.         Assessment & Plan:  Essential hypertension  Routine general medical examination at a health care facility - Plan: CBC with Differential/Platelet, COMPLETE METABOLIC PANEL WITH GFR, Lipid panel  Physical exam is completely normal. Her blood pressure is slightly elevated today. I have asked  the patient to start checking her blood pressure more regularly and notify me if consistently greater than 140/90. Cancer screening is up-to-date. She is due for repeat mammogram in October. She is due for colonoscopy but she asked me to defer this at the present time. She will call me later this year when she is ready for me to schedule this. Pap smears performed at her gynecologist. I will check a CBC, CMP, fasting lipid panel.

## 2016-04-01 ENCOUNTER — Encounter: Payer: Self-pay | Admitting: Family Medicine

## 2016-05-13 ENCOUNTER — Other Ambulatory Visit: Payer: Self-pay | Admitting: Family Medicine

## 2016-05-13 NOTE — Telephone Encounter (Signed)
Refill appropriate and filled per protocol. 

## 2016-06-13 ENCOUNTER — Other Ambulatory Visit: Payer: Self-pay | Admitting: Family Medicine

## 2016-07-19 ENCOUNTER — Other Ambulatory Visit: Payer: Self-pay | Admitting: Family Medicine

## 2016-08-20 ENCOUNTER — Other Ambulatory Visit: Payer: Self-pay | Admitting: Obstetrics & Gynecology

## 2016-08-20 DIAGNOSIS — R921 Mammographic calcification found on diagnostic imaging of breast: Secondary | ICD-10-CM

## 2016-08-21 ENCOUNTER — Ambulatory Visit (INDEPENDENT_AMBULATORY_CARE_PROVIDER_SITE_OTHER): Payer: PRIVATE HEALTH INSURANCE | Admitting: Family Medicine

## 2016-08-21 VITALS — BP 156/90 | HR 80 | Temp 98.6°F | Wt 134.0 lb

## 2016-08-21 DIAGNOSIS — I10 Essential (primary) hypertension: Secondary | ICD-10-CM

## 2016-08-21 DIAGNOSIS — N3001 Acute cystitis with hematuria: Secondary | ICD-10-CM | POA: Diagnosis not present

## 2016-08-21 NOTE — Progress Notes (Signed)
   Subjective:    Patient ID: Melissa Spencer, female    DOB: Sep 24, 1966, 50 y.o.   MRN: [id number]  HPI Patient is here today to follow-up [place] She had a urinary tract infection with gross hematuria. She has a proximally 2 or 3 of these per year. However in the past there is been some question as to whether was actually a urinary tract infection a urinalysis and only revealed blood. CT scanning/renal stone protocol have reveal no cause for persistent hematuria. She has not had cystoscopy. Also in the hospital, her blood pressure was elevated at 170/90. Here today is also elevated. The patient took Macrodantin and her symptoms completely subsided. She feels completely well. Past Medical History:  Diagnosis Date  . 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 Hypertension    No past surgical history on file. Current Outpatient Prescriptions on File Prior to Visit  Medication Sig Dispense Refill  . amLODipine (NORVASC) 5 MG tablet TAKE 1 TABLET BY MOUTH EVERY DAY 30 tablet 11  . hydrochlorothiazide (MICROZIDE) 12.5 MG capsule Take 1 capsule by mouth  daily 90 capsule 3   No current facility-administered medications on file prior to visit.    No Known Allergies Social History   Social History  . Marital status: Single    Spouse name: N/A  . Number of children: N/A  . Years of education: N/A   Occupational History  . Not on file.   Social History Main Topics  . Smoking status: Former Games developermoker  . Smokeless tobacco: Not on file  . Alcohol use Yes     Comment: Occasional  . Drug use: No  . Sexual activity: Yes     Comment: married to policeman, daughter died in MVA   Other Topics Concern  . Not on file   Social History Narrative  . No narrative on file      Review of Systems  All other systems reviewed and are negative.      Objective:   Physical Exam  Cardiovascular: Normal rate, regular rhythm and  normal heart sounds.   Pulmonary/Chest: Effort normal and breath sounds normal. No respiratory distress. She has no wheezes. She has no rales.  Abdominal: Soft. Bowel sounds are normal.  Vitals reviewed.         Assessment & Plan:  Essential hypertension  Acute cystitis with hematuria - Plan: Urine culture, Urinalysis, Routine w reflex microscopic ([facility])  Blood pressures elevated. Recheck blood pressure several times over the next week and notify me of the values. If persistently elevated, I would add an angiotensin receptor blocker to her medication. I would like the patient to return after she has completed her. For urinalysis and a urine culture to ensure resolution. If there is no urinary tract infection but hematuria persists, I recommend a urology consultation for cystoscopy. If urinalysis and urine culture are normal, I would recommend that the patient come in for urine culture next time she has symptoms to confirm that she truly has a bladder infection. She would not need an appointment for this but could simply give us a urine culture. My concern is that she may have interstitial cystitis.

## 2016-08-31 ENCOUNTER — Other Ambulatory Visit: Payer: PRIVATE HEALTH INSURANCE

## 2016-08-31 DIAGNOSIS — N3001 Acute cystitis with hematuria: Secondary | ICD-10-CM

## 2016-09-01 LAB — URINALYSIS, ROUTINE W REFLEX MICROSCOPIC
BILIRUBIN URINE: NEGATIVE
GLUCOSE, UA: NEGATIVE
HGB URINE DIPSTICK: NEGATIVE
KETONES UR: NEGATIVE
LEUKOCYTES UA: NEGATIVE
Nitrite: NEGATIVE
PH: 7 (ref 5.0–8.0)
PROTEIN: NEGATIVE
Specific Gravity, Urine: 1.006 (ref 1.001–1.035)

## 2016-09-02 LAB — URINE CULTURE: Organism ID, Bacteria: NO GROWTH

## 2016-09-16 ENCOUNTER — Ambulatory Visit
Admission: RE | Admit: 2016-09-16 | Discharge: 2016-09-16 | Disposition: A | Discharge status: Home / Self Care | Payer: No Typology Code available for payment source | Source: Ambulatory Visit | Attending: Obstetrics & Gynecology | Admitting: Obstetrics & Gynecology

## 2016-09-16 DIAGNOSIS — R921 Mammographic calcification found on diagnostic imaging of breast: Secondary | ICD-10-CM

## 2016-10-26 ENCOUNTER — Encounter: Payer: Self-pay | Admitting: Physician Assistant

## 2016-10-26 ENCOUNTER — Ambulatory Visit (INDEPENDENT_AMBULATORY_CARE_PROVIDER_SITE_OTHER): Payer: PRIVATE HEALTH INSURANCE | Admitting: Physician Assistant

## 2016-10-26 VITALS — BP 150/82 | HR 91 | Temp 98.3°F | Resp 18 | Wt 128.0 lb

## 2016-10-26 DIAGNOSIS — B349 Viral infection, unspecified: Secondary | ICD-10-CM

## 2016-10-26 NOTE — Progress Notes (Signed)
    Patient ID: Melissa Spencer MRN: [id number], DOB: May 19, 1966, 50 y.o. Date of Encounter: 10/26/2016, 9:28 AM    Chief Complaint:  Chief Complaint  Patient presents with  . low grade fever    over weekend     HPI: 50 y.o. year old white female reports that This past Friday 10/23/16 her throat felt a little bit sore and that day she had a little bit of abdominal pain. On that day she just didn't feel good. She had had a fever up to 101.2. She had no vomiting. Had a little bit of diarrhea. Says that on Saturday morning she felt fine. On Saturday evening she had some low-grade fever. Sunday she felt fine and Tmax was 99.0. She says that she worked Friday. She has missed no work but just wanted to come here and get checked before she went into work today just to make sure that things were okay. Has had no fever today. Temperature reading 98.3 here. says that she had very little nasal congestion on Friday. No other nasal congestion or mucus from the nose. No cough. No abdominal pain since the little that on Friday. She's had no vomiting. Only small amount of diarrhea on Friday and no more diarrhea since then.     Home Meds:   Outpatient Medications Prior to Visit  Medication Sig Dispense Refill  . amLODipine (NORVASC) 5 MG tablet TAKE 1 TABLET BY MOUTH EVERY DAY 30 tablet 11  . hydrochlorothiazide (MICROZIDE) 12.5 MG capsule Take 1 capsule by mouth  daily 90 capsule 3   No facility-administered medications prior to visit.     Allergies: No Known Allergies    Review of Systems: See HPI for pertinent ROS. All other ROS negative.    Physical Exam: Blood pressure (!) 150/82, pulse 91, temperature 98.3 F (36.8 C), temperature source Oral, resp. rate 18, weight 128 lb (58.1 kg), last menstrual period 10/12/2016, SpO2 99 %., Body mass index is 24.59 kg/m. General:  WNWD WF. Appears in no acute distress. HEENT: Normocephalic, atraumatic, eyes without discharge, sclera non-icteric, nares  are without discharge. Bilateral auditory canals clear, TM's are without perforation, pearly grey and translucent with reflective cone of light bilaterally. Oral cavity moist, posterior pharynx without exudate, erythema, peritonsillar abscess.  Neck: Supple. No thyromegaly. No lymphadenopathy. Lungs: Clear bilaterally to auscultation without wheezes, rales, or rhonchi. Breathing is unlabored. Heart: Regular rhythm. No murmurs, rubs, or gallops. Abdomen: Soft, non-tender, non-distended with normoactive bowel sounds. No hepatomegaly. No rebound/guarding. No obvious abdominal masses. Msk:  Strength and tone normal for age. Extremities/Skin: Warm and dry.  Neuro: Alert and oriented X 3. Moves all extremities spontaneously. Gait is normal. CNII-XII grossly in tact. Psych:  Responds to questions appropriately with a normal affect.     ASSESSMENT AND PLAN:  50 y.o. year old female with  1. Viral illness Reassured patient that suspect that she had a viral illness that is resolving. At this point I see no signs of active infection. If she develops further fever or additional signs or symptoms then follow-up.   Murray HodgkinsSigned, Mary Beth Los MolinosDixon, [place], [facility] 10/26/2016 9:28 AM

## 2017-03-02 ENCOUNTER — Other Ambulatory Visit: Payer: Self-pay | Admitting: Obstetrics & Gynecology

## 2017-03-02 DIAGNOSIS — R921 Mammographic calcification found on diagnostic imaging of breast: Secondary | ICD-10-CM

## 2017-03-16 ENCOUNTER — Ambulatory Visit
Admission: RE | Admit: 2017-03-16 | Discharge: 2017-03-16 | Disposition: A | Discharge status: Home / Self Care | Payer: No Typology Code available for payment source | Source: Ambulatory Visit | Attending: Obstetrics & Gynecology | Admitting: Obstetrics & Gynecology

## 2017-03-16 DIAGNOSIS — R921 Mammographic calcification found on diagnostic imaging of breast: Secondary | ICD-10-CM

## 2017-06-26 ENCOUNTER — Other Ambulatory Visit: Payer: Self-pay | Admitting: Family Medicine

## 2017-07-31 ENCOUNTER — Other Ambulatory Visit: Payer: Self-pay | Admitting: Family Medicine

## 2017-09-05 ENCOUNTER — Other Ambulatory Visit: Payer: Self-pay | Admitting: Family Medicine

## 2017-10-02 ENCOUNTER — Other Ambulatory Visit: Payer: Self-pay | Admitting: Family Medicine

## 2017-10-05 ENCOUNTER — Other Ambulatory Visit: Payer: Self-pay | Admitting: Family Medicine

## 2017-11-08 ENCOUNTER — Other Ambulatory Visit: Payer: Self-pay | Admitting: Family Medicine

## 2017-11-08 NOTE — Telephone Encounter (Signed)
Medication refill for one time only.  Patient needs to be seen.  Letter sent for patient to call and schedule.  Has been more then 1 year since last OV 

## 2017-11-30 ENCOUNTER — Other Ambulatory Visit: Payer: Self-pay | Admitting: Family Medicine

## 2017-11-30 ENCOUNTER — Other Ambulatory Visit: Payer: No Typology Code available for payment source

## 2017-11-30 DIAGNOSIS — Z0000 Encounter for general adult medical examination without abnormal findings: Secondary | ICD-10-CM

## 2017-11-30 DIAGNOSIS — E785 Hyperlipidemia, unspecified: Secondary | ICD-10-CM

## 2017-11-30 DIAGNOSIS — I10 Essential (primary) hypertension: Secondary | ICD-10-CM

## 2017-11-30 DIAGNOSIS — Z79899 Other long term (current) drug therapy: Secondary | ICD-10-CM

## 2017-11-30 LAB — COMPLETE METABOLIC PANEL WITH GFR
AG RATIO: 1.8 (calc) (ref 1.0–2.5)
ALKALINE PHOSPHATASE (APISO): 58 U/L (ref 33–130)
ALT: 13 U/L (ref 6–29)
AST: 12 U/L (ref 10–35)
Albumin: 4.4 g/dL (ref 3.6–5.1)
BILIRUBIN TOTAL: 0.4 mg/dL (ref 0.2–1.2)
BUN: 11 mg/dL (ref 7–25)
CHLORIDE: 103 mmol/L (ref 98–110)
CO2: 26 mmol/L (ref 20–32)
Calcium: 9.7 mg/dL (ref 8.6–10.4)
Creat: 0.77 mg/dL (ref 0.50–1.05)
GFR, Est African American: 104 mL/min/{1.73_m2} (ref >=60)
GFR, Est Non African American: 89 mL/min/{1.73_m2} (ref >=60)
GLUCOSE: 98 mg/dL (ref 65–99)
Globulin: 2.4 g/dL (calc) (ref 1.9–3.7)
POTASSIUM: 4.6 mmol/L (ref 3.5–5.3)
Sodium: 139 mmol/L (ref 135–146)
Total Protein: 6.8 g/dL (ref 6.1–8.1)

## 2017-11-30 LAB — CBC WITH DIFFERENTIAL/PLATELET
BASOS PCT: 0.4 %
Basophils Absolute: 37 cells/uL (ref 0–200)
EOS ABS: 46 {cells}/uL (ref 15–500)
Eosinophils Relative: 0.5 %
HCT: 39.3 % (ref 35.0–45.0)
HEMOGLOBIN: 13.4 g/dL (ref 11.7–15.5)
LYMPHS ABS: 2392 {cells}/uL (ref 850–3900)
MCH: 31.1 pg (ref 27.0–33.0)
MCHC: 34.1 g/dL (ref 32.0–36.0)
MCV: 91.2 fL (ref 80.0–100.0)
MPV: 10.2 fL (ref 7.5–12.5)
Monocytes Relative: 7.4 %
NEUTROS ABS: 6044 {cells}/uL (ref 1500–7800)
Neutrophils Relative %: 65.7 %
PLATELETS: 354 10*3/uL (ref 140–400)
RBC: 4.31 10*6/uL (ref 3.80–5.10)
RDW: 12 % (ref 11.0–15.0)
Total Lymphocyte: 26 %
WBC: 9.2 10*3/uL (ref 3.8–10.8)
WBCMIX: 681 {cells}/uL (ref 200–950)

## 2017-11-30 LAB — LIPID PANEL
CHOLESTEROL: 182 mg/dL (ref <200)
HDL: 68 mg/dL (ref >50)
LDL CHOLESTEROL (CALC): 84 mg/dL
Non-HDL Cholesterol (Calc): 114 mg/dL (calc) (ref <130)
TRIGLYCERIDES: 205 mg/dL — AB (ref <150)
Total CHOL/HDL Ratio: 2.7 (calc) (ref <5.0)

## 2017-12-02 ENCOUNTER — Encounter: Payer: Self-pay | Admitting: Family Medicine

## 2017-12-02 ENCOUNTER — Ambulatory Visit (INDEPENDENT_AMBULATORY_CARE_PROVIDER_SITE_OTHER): Payer: No Typology Code available for payment source | Admitting: Family Medicine

## 2017-12-02 VITALS — BP 148/80 | HR 84 | Temp 98.1°F | Resp 14 | Ht 65.0 in | Wt 133.0 lb

## 2017-12-02 DIAGNOSIS — Z0000 Encounter for general adult medical examination without abnormal findings: Secondary | ICD-10-CM

## 2017-12-02 DIAGNOSIS — I10 Essential (primary) hypertension: Secondary | ICD-10-CM

## 2017-12-02 NOTE — Progress Notes (Signed)
Subjective:    Patient ID: Melissa Spencer, female    DOB: Feb 28, 1966, 51 y.o.   MRN: [id number]  HPI Patient is a very pleasant 51 year old white female who presents today for a general medical exam. She sees a gynecologist who performs her Pap smear. Her last mammogram was performed this spring and was normal.  She is due for colonoscopy. Tetanus shot is up-to-date. She politely declines a flu shot. She does not want a colonoscopy but she would consider Cologuard.  Her blood pressure today is elevated. It has been elevated her last 3 visits. However she is convinced that she has white coat syndrome. She checks her blood pressure everyday at home and it's much better. She agrees to check her blood pressure everyday and notify me of the values. Otherwise she is doing well. Past Medical History:  Diagnosis Date  . 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 Hypertension    No past surgical history on file. Current Outpatient Medications on File Prior to Visit  Medication Sig Dispense Refill  . amLODipine (NORVASC) 5 MG tablet TAKE 1 TABLET BY MOUTH EVERY DAY 30 tablet 0  . hydrochlorothiazide (MICROZIDE) 12.5 MG capsule TAKE 1 CAPSULE BY MOUTH  DAILY 90 capsule 1   No current facility-administered medications on file prior to visit.    No Known Allergies Social History   Socioeconomic History  . Marital status: Single    Spouse name: Not on file  . Number of children: Not on file  . Years of education: Not on file  . Highest education level: Not on file  Social Needs  . Financial resource strain: Not on file  . Food insecurity - worry: Not on file  . Food insecurity - inability: Not on file  . Transportation needs - medical: Not on file  . Transportation needs - non-medical: Not on file  Occupational History  . Not on file  Tobacco Use  . Smoking status: Former Games developermoker  . Smokeless tobacco: Never Used  Substance and Sexual Activity  . Alcohol use:  Yes    Comment: Occasional  . Drug use: No  . 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 Not on file  Social History Narrative  . Not on file   Family History  Problem Relation Age of Onset  . Hyperlipidemia Mother   . Hypertension Mother   . Hyperlipidemia Father   . Heart disease Father       Review of Systems  All other systems reviewed and are negative.      Objective:   Physical Exam  Constitutional: She is oriented to person, place, and time. She appears well-developed and well-nourished. No distress.  HENT:  Head: Normocephalic and atraumatic.  Right Ear: External ear normal.  Left Ear: External ear normal.  Nose: Nose normal.  Mouth/Throat: Oropharynx is clear and moist. No oropharyngeal exudate.  Eyes: Conjunctivae and EOM are normal. Pupils are equal, round, and reactive to light. Right eye exhibits no discharge. Left eye exhibits no discharge. No scleral icterus.  Neck: Normal range of motion. Neck supple. No JVD present. No tracheal deviation present. No thyromegaly present.  Cardiovascular: Normal rate, regular rhythm, normal heart sounds and intact distal pulses. Exam reveals no gallop and no friction rub.  No murmur heard. Pulmonary/Chest: Effort normal and breath sounds normal. No stridor. No respiratory distress. She has no  wheezes. She has no rales. She exhibits no tenderness.  Abdominal: Soft. Bowel sounds are normal. She exhibits no distension and no mass. There is no tenderness. There is no rebound and no guarding.  Musculoskeletal: Normal range of motion. She exhibits no edema or tenderness.  Lymphadenopathy:    She has no cervical adenopathy.  Neurological: She is alert and oriented to person, place, and time. She has normal reflexes. No cranial nerve deficit. She exhibits normal muscle tone. Coordination normal.  Skin: Skin is warm. No rash noted. She is not diaphoretic. No erythema. No  pallor.  Psychiatric: She has a normal mood and affect. Her behavior is normal. Judgment and thought content normal.  Vitals reviewed.         Assessment & Plan:  Routine general medical examination at a health care facility  Essential hypertension  Physical exam is completely normal. Her blood pressure is slightly elevated today. I have asked the patient to start checking her blood pressure more regularly and notify me if consistently greater than 140/90. Pap smear is up-to-date. Patient refuses a colonoscopy but will let me schedule her for cologuard.  Patient refuses a flu shot. Other immunizations are up-to-date. We did discuss the shingles vaccine

## 2017-12-05 ENCOUNTER — Other Ambulatory Visit: Payer: Self-pay | Admitting: Family Medicine

## 2017-12-17 LAB — COLOGUARD: COLOGUARD: NEGATIVE

## 2017-12-29 ENCOUNTER — Encounter: Payer: Self-pay | Admitting: Family Medicine

## 2018-02-09 ENCOUNTER — Other Ambulatory Visit: Payer: Self-pay | Admitting: Family Medicine

## 2018-02-11 ENCOUNTER — Other Ambulatory Visit: Payer: Self-pay | Admitting: Obstetrics & Gynecology

## 2018-02-11 DIAGNOSIS — R921 Mammographic calcification found on diagnostic imaging of breast: Secondary | ICD-10-CM

## 2018-03-18 ENCOUNTER — Ambulatory Visit
Admission: RE | Admit: 2018-03-18 | Discharge: 2018-03-18 | Disposition: A | Discharge status: Home / Self Care | Payer: No Typology Code available for payment source | Source: Ambulatory Visit | Attending: Obstetrics & Gynecology | Admitting: Obstetrics & Gynecology

## 2018-03-18 DIAGNOSIS — R921 Mammographic calcification found on diagnostic imaging of breast: Secondary | ICD-10-CM

## 2018-10-11 ENCOUNTER — Other Ambulatory Visit: Payer: Self-pay | Admitting: Family Medicine

## 2018-11-07 IMAGING — MG 2D DIGITAL DIAGNOSTIC BILATERAL MAMMOGRAM WITH CAD AND ADJUNCT T
8 of 15 series · 8 of 31 positions shown · non-contrast
Comparison: Prior studies including 09/16/2016

CLINICAL DATA: One year followup for left breast calcifications.

EXAM:
2D DIGITAL DIAGNOSTIC BILATERAL MAMMOGRAM WITH CAD AND ADJUNCT TOMO

[L ML (1 of 2)]
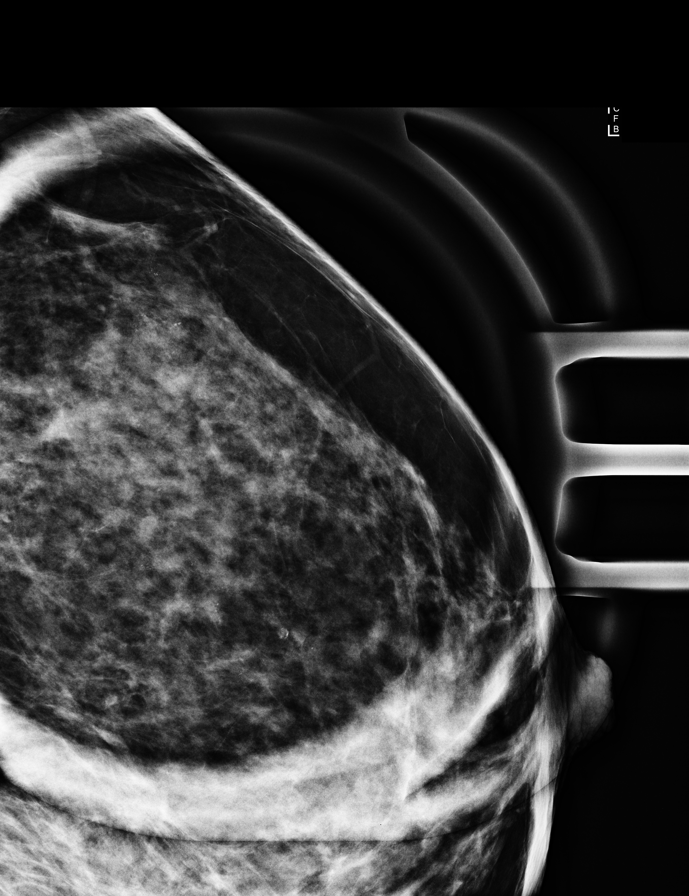

[L CC (1 of 2)]
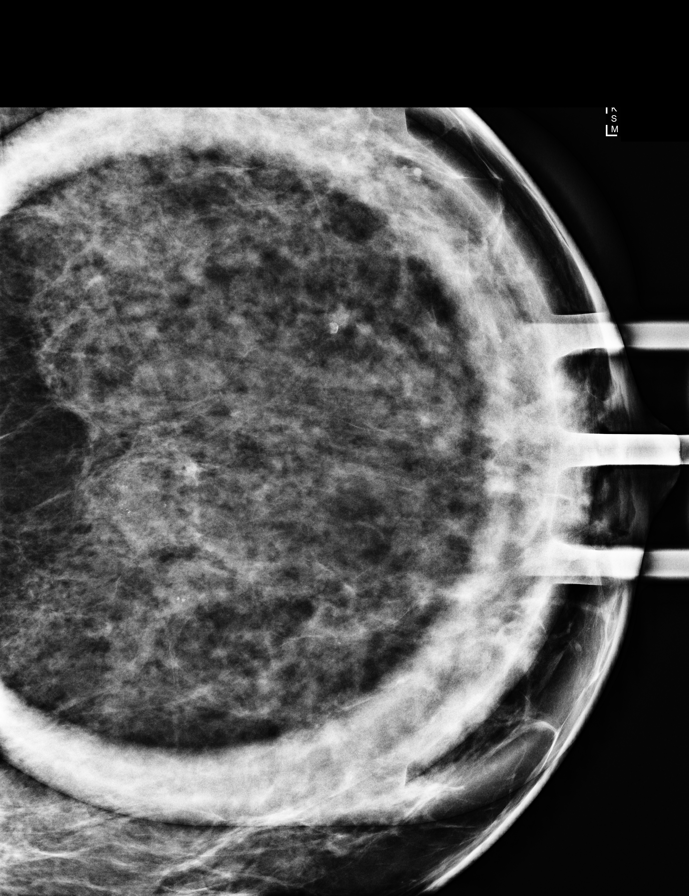

[L ML (2 of 2)]
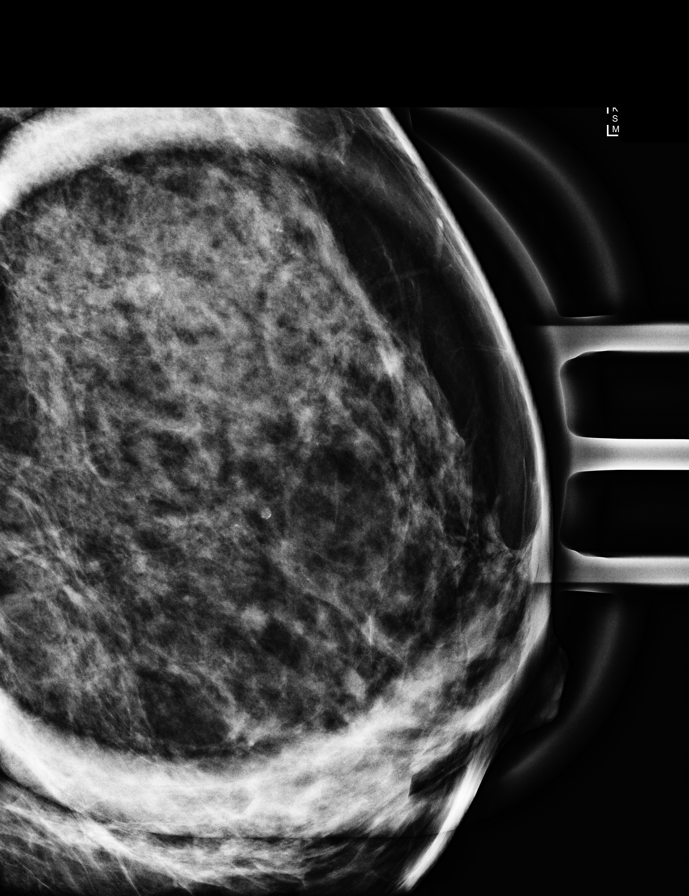

[L MLO synth-2D]
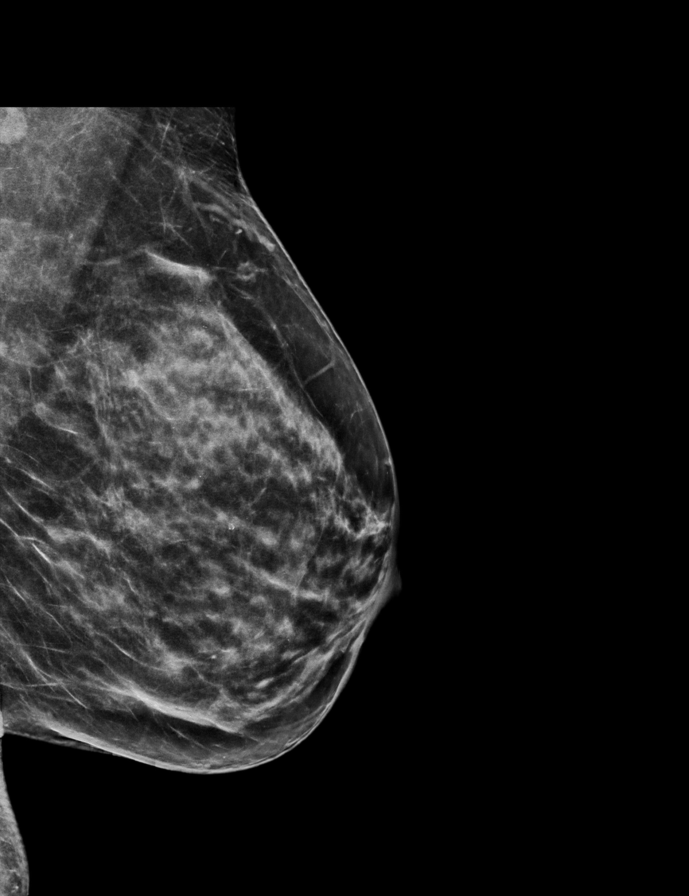

[R CC synth-2D]
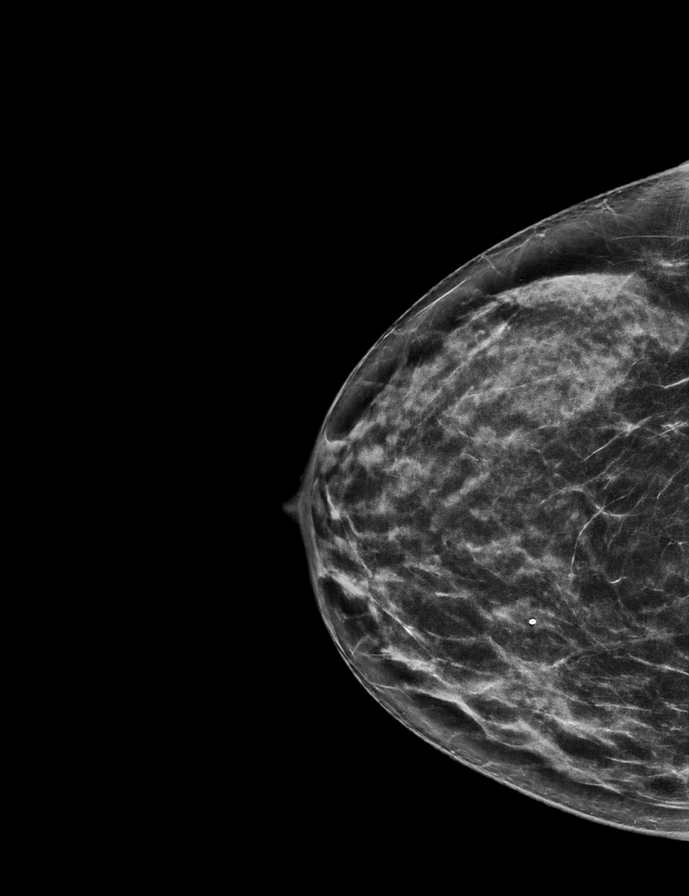

[R MLO synth-2D]
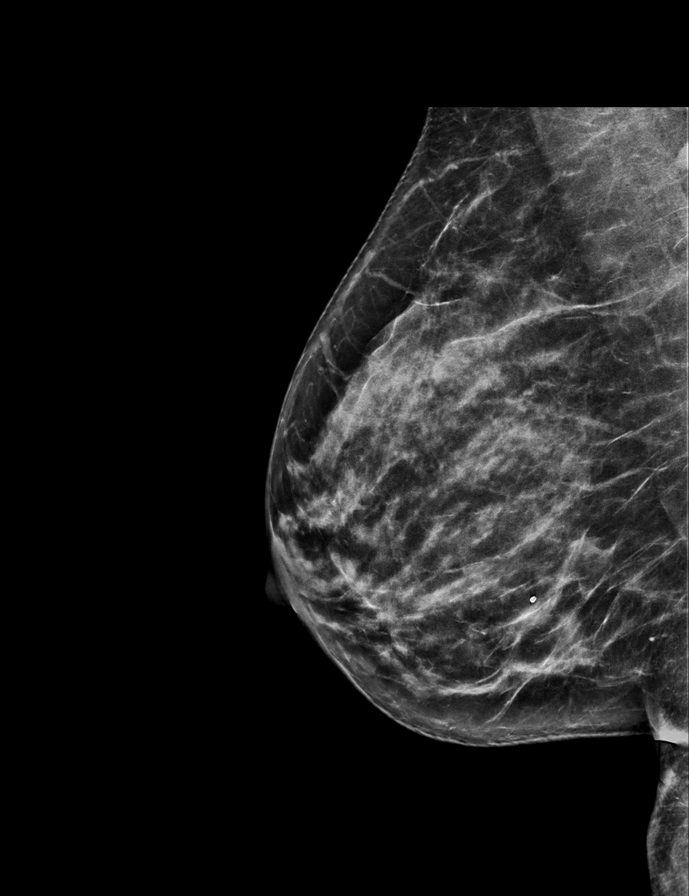

[L CC (2 of 2)]
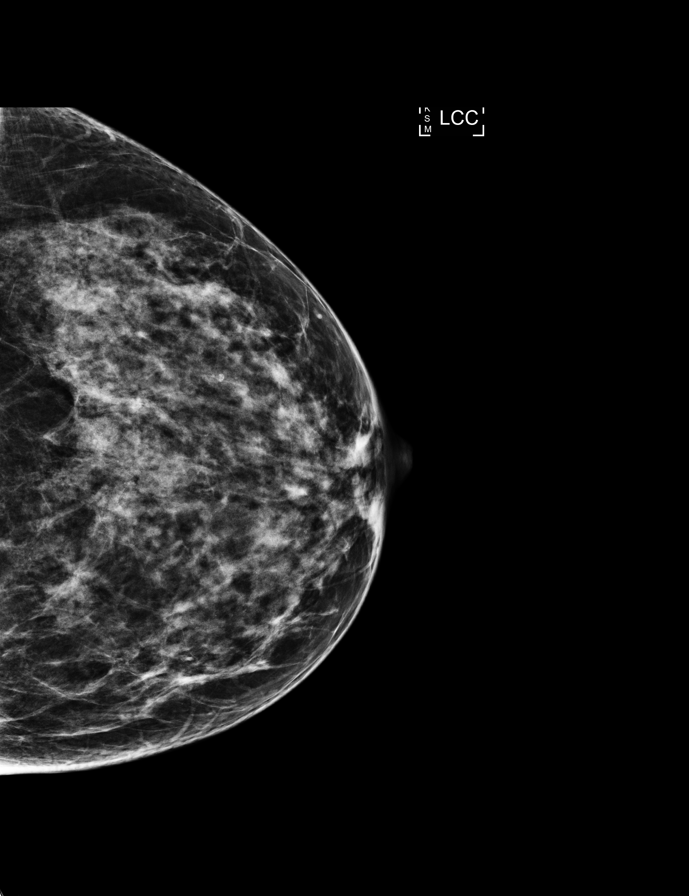

[L MLO]
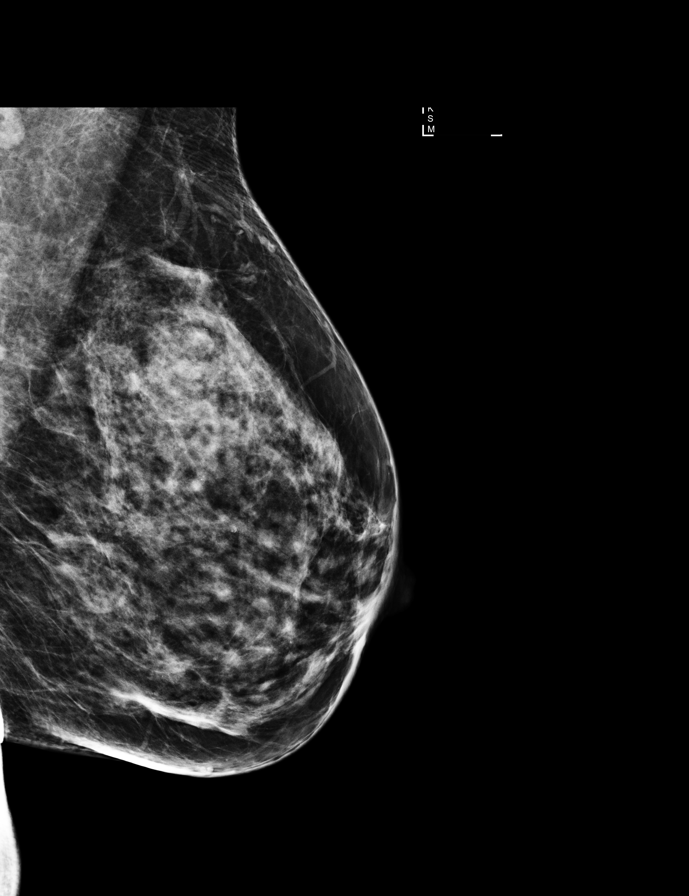

[8 of 31 positions shown; findings below may reference images not displayed]

ACR Breast Density Category c: The breast tissue is heterogeneously
dense, which may obscure small masses.
FINDINGS: No suspicious mass, distortion, or microcalcifications are
identified to suggest presence of malignancy. Magnified views are
performed of calcifications in the upper central portion of the left
breast. There is stable appearance of primarily punctate
calcifications in the upper central portion of the left breast. The
group of calcifications measures approximately 8 mm. No suspicious
morphology or distribution of calcifications identified.

Mammographic images were processed with CAD.
IMPRESSION: Stable, benign-appearing left breast calcifications. Continued
followup is recommended to document 2 years of stability.

RECOMMENDATION:
Bilateral diagnostic mammogram is recommended in 1 year.

I have discussed the findings and recommendations with the patient.
Results were also provided in writing at the conclusion of the
visit. If applicable, a reminder letter will be sent to the patient
regarding the next appointment.

BI-RADS CATEGORY  3: Probably benign.

## 2018-12-22 ENCOUNTER — Other Ambulatory Visit: Payer: No Typology Code available for payment source

## 2018-12-23 ENCOUNTER — Encounter: Payer: No Typology Code available for payment source | Admitting: Family Medicine

## 2018-12-28 ENCOUNTER — Other Ambulatory Visit: Payer: Self-pay | Admitting: Family Medicine

## 2018-12-28 DIAGNOSIS — E785 Hyperlipidemia, unspecified: Secondary | ICD-10-CM

## 2018-12-28 DIAGNOSIS — Z0000 Encounter for general adult medical examination without abnormal findings: Secondary | ICD-10-CM

## 2018-12-28 DIAGNOSIS — I10 Essential (primary) hypertension: Secondary | ICD-10-CM

## 2018-12-28 DIAGNOSIS — Z79899 Other long term (current) drug therapy: Secondary | ICD-10-CM

## 2018-12-30 ENCOUNTER — Other Ambulatory Visit: Payer: Managed Care, Other (non HMO)

## 2018-12-30 DIAGNOSIS — Z79899 Other long term (current) drug therapy: Secondary | ICD-10-CM

## 2018-12-30 DIAGNOSIS — E785 Hyperlipidemia, unspecified: Secondary | ICD-10-CM

## 2018-12-30 DIAGNOSIS — I10 Essential (primary) hypertension: Secondary | ICD-10-CM

## 2018-12-30 DIAGNOSIS — Z0000 Encounter for general adult medical examination without abnormal findings: Secondary | ICD-10-CM

## 2018-12-30 LAB — CBC WITH DIFFERENTIAL/PLATELET
Absolute Monocytes: 554 cells/uL (ref 200–950)
Basophils Absolute: 40 cells/uL (ref 0–200)
Basophils Relative: 0.6 %
EOS PCT: 2.3 %
Eosinophils Absolute: 152 cells/uL (ref 15–500)
HCT: 39.8 % (ref 35.0–45.0)
Hemoglobin: 13.5 g/dL (ref 11.7–15.5)
Lymphs Abs: 2508 cells/uL (ref 850–3900)
MCH: 31.3 pg (ref 27.0–33.0)
MCHC: 33.9 g/dL (ref 32.0–36.0)
MCV: 92.1 fL (ref 80.0–100.0)
MONOS PCT: 8.4 %
MPV: 10.9 fL (ref 7.5–12.5)
Neutro Abs: 3346 cells/uL (ref 1500–7800)
Neutrophils Relative %: 50.7 %
Platelets: 346 10*3/uL (ref 140–400)
RBC: 4.32 10*6/uL (ref 3.80–5.10)
RDW: 12 % (ref 11.0–15.0)
Total Lymphocyte: 38 %
WBC: 6.6 10*3/uL (ref 3.8–10.8)

## 2018-12-30 LAB — COMPREHENSIVE METABOLIC PANEL
AG Ratio: 1.8 (calc) (ref 1.0–2.5)
ALKALINE PHOSPHATASE (APISO): 72 U/L (ref 33–130)
ALT: 12 U/L (ref 6–29)
AST: 13 U/L (ref 10–35)
Albumin: 4.6 g/dL (ref 3.6–5.1)
BILIRUBIN TOTAL: 0.3 mg/dL (ref 0.2–1.2)
BUN: 14 mg/dL (ref 7–25)
CALCIUM: 10.1 mg/dL (ref 8.6–10.4)
CO2: 27 mmol/L (ref 20–32)
Chloride: 101 mmol/L (ref 98–110)
Creat: 0.75 mg/dL (ref 0.50–1.05)
Globulin: 2.5 g/dL (calc) (ref 1.9–3.7)
Glucose, Bld: 105 mg/dL — ABNORMAL HIGH (ref 65–99)
Potassium: 4.5 mmol/L (ref 3.5–5.3)
Sodium: 139 mmol/L (ref 135–146)
Total Protein: 7.1 g/dL (ref 6.1–8.1)

## 2018-12-30 LAB — LIPID PANEL
Cholesterol: 206 mg/dL — ABNORMAL HIGH (ref <200)
HDL: 64 mg/dL (ref >50)
LDL CHOLESTEROL (CALC): 111 mg/dL — AB
Non-HDL Cholesterol (Calc): 142 mg/dL (calc) — ABNORMAL HIGH (ref <130)
Total CHOL/HDL Ratio: 3.2 (calc) (ref <5.0)
Triglycerides: 184 mg/dL — ABNORMAL HIGH (ref <150)

## 2018-12-30 LAB — TSH: TSH: 1.11 mIU/L

## 2019-01-02 ENCOUNTER — Other Ambulatory Visit: Payer: Self-pay | Admitting: Family Medicine

## 2019-01-03 ENCOUNTER — Ambulatory Visit (INDEPENDENT_AMBULATORY_CARE_PROVIDER_SITE_OTHER): Payer: Managed Care, Other (non HMO) | Admitting: Family Medicine

## 2019-01-03 ENCOUNTER — Encounter: Payer: Self-pay | Admitting: Family Medicine

## 2019-01-03 VITALS — BP 130/80 | HR 90 | Temp 98.1°F | Resp 18 | Ht 65.0 in | Wt 133.0 lb

## 2019-01-03 DIAGNOSIS — I10 Essential (primary) hypertension: Secondary | ICD-10-CM

## 2019-01-03 DIAGNOSIS — E785 Hyperlipidemia, unspecified: Secondary | ICD-10-CM

## 2019-01-03 DIAGNOSIS — Z0000 Encounter for general adult medical examination without abnormal findings: Secondary | ICD-10-CM

## 2019-01-03 NOTE — Progress Notes (Signed)
Subjective:    Patient ID: Melissa Spencer, female    DOB: 1966/08/29, 53 y.o.   MRN: [id number]  HPI Patient is a very pleasant 53 year old white female who presents today for a general medical exam. She sees a gynecologist who performs her Pap smear.  She had a gram in April 2019.  She already has a scheduled for April of this year.  She has never had a colonoscopy.  She declines a colonoscopy.  She did have Cologuard last year which was negative.  Therefore she is due for repeat Cologuard in 2 years.  She denies any melena or hematochezia or change in her bowel habits.  He is due for a tetanus shot as well as a flu shot.  She politely declines both of these vaccines.  She is also due for HIV screening however she politely declines this due to lack of risk factors.  Her blood pressure today is well controlled at 130/80.  Her most recent lab work is listed below and is significant for rise in her fasting blood sugar as well as a rise in her cholesterol. Appointment on 12/30/2018  Component Date Value Ref Range Status  . TSH 12/30/2018 1.11  mIU/L Final   Comment:           Reference Range .           > or = 20 Years  0.40-4.50 .                Pregnancy Ranges           First trimester    0.26-2.66           Second trimester   0.55-2.73           Third trimester    0.43-2.91   . Cholesterol 12/30/2018 206* <200 mg/dL Final  . HDL 40/98/1191 64  >50 mg/dL Final  . Triglycerides 12/30/2018 184* <150 mg/dL Final  . LDL Cholesterol (Calc) 12/30/2018 111* mg/dL (calc) Final   Comment: Reference range: <100 . Desirable range <100 mg/dL for primary prevention;   <70 mg/dL for patients with CHD or diabetic patients  with > or = 2 CHD risk factors. Marland Kitchen LDL-C is now calculated using the Martin-Hopkins  calculation, which is a validated novel method providing  better accuracy than the Friedewald equation in the  estimation of LDL-C.  Horald Pollen et al. Lenox Ahr. 4782;956(21): 2061-2068    (http://education.[facility].com/faq/FAQ164)   . Total CHOL/HDL Ratio 12/30/2018 3.2  <3.0 (calc) Final  . Non-HDL Cholesterol (Calc) 12/30/2018 142* <130 mg/dL (calc) Final   Comment: For patients with diabetes plus 1 major ASCVD risk  factor, treating to a non-HDL-C goal of <100 mg/dL  (LDL-C of <86 mg/dL) is considered a therapeutic  option.   . Glucose, Bld 12/30/2018 105* 65 - 99 mg/dL Final   Comment: .            Fasting reference interval . For someone without known diabetes, a glucose value between 100 and 125 mg/dL is consistent with prediabetes and should be confirmed with a follow-up test. .   . BUN 12/30/2018 14  7 - 25 mg/dL Final  . Creat 57/84/6962 0.75  0.50 - 1.05 mg/dL Final   Comment: For patients >16 years of age, the reference limit for Creatinine is approximately 13% higher for people identified as African-American. .   Edwena Felty Ratio 12/30/2018 NOT APPLICABLE  6 - 22 (calc) Final  . Sodium 12/30/2018 139  135 - 146 mmol/L Final  . Potassium 12/30/2018 4.5  3.5 - 5.3 mmol/L Final  . Chloride 12/30/2018 101  98 - 110 mmol/L Final  . CO2 12/30/2018 27  20 - 32 mmol/L Final  . Calcium 12/30/2018 10.1  8.6 - 10.4 mg/dL Final  . Total Protein 12/30/2018 7.1  6.1 - 8.1 g/dL Final  . Albumin 16/09/9603 4.6  3.6 - 5.1 g/dL Final  . Globulin 54/08/8118 2.5  1.9 - 3.7 g/dL (calc) Final  . AG Ratio 12/30/2018 1.8  1.0 - 2.5 (calc) Final  . Total Bilirubin 12/30/2018 0.3  0.2 - 1.2 mg/dL Final  . Alkaline phosphatase (APISO) 12/30/2018 72  33 - 130 U/L Final  . AST 12/30/2018 13  10 - 35 U/L Final  . ALT 12/30/2018 12  6 - 29 U/L Final  . WBC 12/30/2018 6.6  3.8 - 10.8 Thousand/uL Final  . RBC 12/30/2018 4.32  3.80 - 5.10 Million/uL Final  . Hemoglobin 12/30/2018 13.5  11.7 - 15.5 g/dL Final  . HCT 14/78/2956 39.8  35.0 - 45.0 % Final  . MCV 12/30/2018 92.1  80.0 - 100.0 fL Final  . MCH 12/30/2018 31.3  27.0 - 33.0 pg Final  . MCHC 12/30/2018  33.9  32.0 - 36.0 g/dL Final  . RDW 21/30/8657 12.0  11.0 - 15.0 % Final  . Platelets 12/30/2018 346  140 - 400 Thousand/uL Final  . MPV 12/30/2018 10.9  7.5 - 12.5 fL Final  . Neutro Abs 12/30/2018 3,346  1,500 - 7,800 cells/uL Final  . Lymphs Abs 12/30/2018 2,508  850 - 3,900 cells/uL Final  . Absolute Monocytes 12/30/2018 554  200 - 950 cells/uL Final  . Eosinophils Absolute 12/30/2018 152  15 - 500 cells/uL Final  . Basophils Absolute 12/30/2018 40  0 - 200 cells/uL Final  . Neutrophils Relative % 12/30/2018 50.7  % Final  . Total Lymphocyte 12/30/2018 38.0  % Final  . Monocytes Relative 12/30/2018 8.4  % Final  . Eosinophils Relative 12/30/2018 2.3  % Final  . Basophils Relative 12/30/2018 0.6  % Final    Past Medical History:  Diagnosis Date  . 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 Hypertension    No past surgical history on file. Current Outpatient Medications on File Prior to Visit  Medication Sig Dispense Refill  . amLODipine (NORVASC) 5 MG tablet TAKE 1 TABLET BY MOUTH EVERY DAY 30 tablet 11  . hydrochlorothiazide (MICROZIDE) 12.5 MG capsule TAKE 1 CAPSULE BY MOUTH  DAILY 90 capsule 1   No current facility-administered medications on file prior to visit.    No Known Allergies Social History   Socioeconomic History  . Marital status: Single    Spouse name: Not on file  . Number of children: Not on file  . Years of education: Not on file  . Highest education level: Not on file  Occupational History  . Not on file  Social Needs  . Financial resource strain: Not on file  . Food insecurity:    Worry: Not on file    Inability: Not on file  . Transportation needs:    Medical: Not on file    Non-medical: Not on file  Tobacco Use  . Smoking status: Former Games developer  . Smokeless tobacco: Never Used  Substance and Sexual Activity  . Alcohol use: Yes    Comment: Occasional  . Drug use: No  . Sexual activity: Yes  Comment:  married to policeman, daughter died in MVA  Lifestyle  . Physical activity:    Days per week: Not on file    Minutes per session: Not on file  . Stress: Not on file  Relationships  . Social connections:    Talks on phone: Not on file    Gets together: Not on file    Attends religious service: Not on file    Active member of club or organization: Not on file    Attends meetings of clubs or organizations: Not on file    Relationship status: Not on file  . Intimate partner violence:    Fear of current or ex partner: Not on file    Emotionally abused: Not on file    Physically abused: Not on file    Forced sexual activity: Not on file  Other Topics Concern  . Not on file  Social History Narrative  . Not on file   Family History  Problem Relation Age of Onset  . Hyperlipidemia Mother   . Hypertension Mother   . Hyperlipidemia Father   . Heart disease Father       Review of Systems  All other systems reviewed and are negative.      Objective:   Physical Exam  Constitutional: She is oriented to person, place, and time. She appears well-developed and well-nourished. No distress.  HENT:  Head: Normocephalic and atraumatic.  Right Ear: External ear normal.  Left Ear: External ear normal.  Nose: Nose normal.  Mouth/Throat: Oropharynx is clear and moist. No oropharyngeal exudate.  Eyes: Pupils are equal, round, and reactive to light. Conjunctivae and EOM are normal. Right eye exhibits no discharge. Left eye exhibits no discharge. No scleral icterus.  Neck: Normal range of motion. Neck supple. No JVD present. No tracheal deviation present. No thyromegaly present.  Cardiovascular: Normal rate, regular rhythm, normal heart sounds and intact distal pulses. Exam reveals no gallop and no friction rub.  No murmur heard. Pulmonary/Chest: Effort normal and breath sounds normal. No stridor. No respiratory distress. She has no wheezes. She has no rales. She exhibits no tenderness.    Abdominal: Soft. Bowel sounds are normal. She exhibits no distension and no mass. There is no abdominal tenderness. There is no rebound and no guarding.  Musculoskeletal: Normal range of motion.        General: No tenderness or edema.  Lymphadenopathy:    She has no cervical adenopathy.  Neurological: She is alert and oriented to person, place, and time. She has normal reflexes. No cranial nerve deficit. She exhibits normal muscle tone. Coordination normal.  Skin: Skin is warm. No rash noted. She is not diaphoretic. No erythema. No pallor.  Psychiatric: She has a normal mood and affect. Her behavior is normal. Judgment and thought content normal.  Vitals reviewed.         Assessment & Plan:  Routine general medical examination at a health care facility  Essential hypertension  Hyperlipidemia, unspecified hyperlipidemia type  Physical exam is completely normal.  Her blood pressure today is well controlled.  Cologuard screening is up-to-date.  Mammogram has been scheduled for April.  She gets her Pap smear at her gynecologist and this is up-to-date as well.  Recommended a flu shot as well as a tetanus shot but the patient politely declined both.  She also declines HIV screening.  Reviewed lab work with her in detail.  Discussed a low carbohydrate diet, aerobic exercise, and 5 to 10 pounds of  weight loss to help address her elevated blood sugar as well as her elevated cholesterol and triglycerides.  Recheck this in 1 year.

## 2019-02-24 ENCOUNTER — Other Ambulatory Visit: Payer: Self-pay | Admitting: *Deleted

## 2019-02-24 MED ORDER — HYDROCHLOROTHIAZIDE 12.5 MG PO CAPS: 12.5000 mg | ORAL_CAPSULE | Freq: Every day | ORAL | 1 refills | Status: DC

## 2019-08-12 ENCOUNTER — Other Ambulatory Visit: Payer: Self-pay | Admitting: Family Medicine

## 2019-11-26 ENCOUNTER — Other Ambulatory Visit: Payer: Self-pay | Admitting: Family Medicine

## 2020-02-12 ENCOUNTER — Other Ambulatory Visit: Payer: Self-pay | Admitting: Family Medicine

## 2020-08-02 ENCOUNTER — Other Ambulatory Visit: Payer: Self-pay

## 2020-08-02 ENCOUNTER — Telehealth (INDEPENDENT_AMBULATORY_CARE_PROVIDER_SITE_OTHER): Payer: 59 | Admitting: Family Medicine

## 2020-08-02 DIAGNOSIS — R509 Fever, unspecified: Secondary | ICD-10-CM | POA: Diagnosis not present

## 2020-08-02 NOTE — Progress Notes (Addendum)
Subjective:    Patient ID: Melissa Spencer, female    DOB: 12-09-1966, 54 y.o.   MRN: [id number]  HPI  Patient is a very pleasant 54 year old Caucasian female who is being seen today as a telephone visit.  She is currently in her home.  I am currently in my office.  She consents to be seen over the telephone.  Phone call began at 820.  Phone call concluded at 832.  Patient states her symptoms began Friday 1 week ago.  She states that she developed a dry cough.  Within a few days she began developing chills and a low-grade fever.  She also reports chest congestion.  She reports a mild sore throat and myalgias and fatigue.  She denies any sinus pain.  She denies any pain in her teeth.  She denies any shortness of breath.  She denies any chest pain.  She has not had her Covid vaccination. Past Medical History:  Diagnosis Date  . 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 Hypertension    No past surgical history on file. Current Outpatient Medications on File Prior to Visit  Medication Sig Dispense Refill  . amLODipine (NORVASC) 5 MG tablet TAKE 1 TABLET BY MOUTH EVERY DAY 90 tablet 3  . hydrochlorothiazide (MICROZIDE) 12.5 MG capsule TAKE 1 CAPSULE BY MOUTH EVERY DAY 90 capsule 1   No current facility-administered medications on file prior to visit.   No Known Allergies Social History   Socioeconomic History  . Marital status: Single    Spouse name: Not on file  . Number of children: Not on file  . Years of education: Not on file  . Highest education level: Not on file  Occupational History  . Not on file  Tobacco Use  . Smoking status: Former Games developer  . Smokeless tobacco: Never Used  Substance and Sexual Activity  . Alcohol use: Yes    Comment: Occasional  . Drug use: No  . 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 Not on file  Social History Narrative  . Not on file   Social  Determinants of Health   Financial Resource Strain:   . Difficulty of Paying Living Expenses: Not on file  Food Insecurity:   . Worried About Programme researcher, broadcasting/film/video in the Last Year: Not on file  . Ran Out of Food in the Last Year: Not on file  Transportation Needs:   . Lack of Transportation (Medical): Not on file  . Lack of Transportation (Non-Medical): Not on file  Physical Activity:   . Days of Exercise per Week: Not on file  . Minutes of Exercise per Session: Not on file  Stress:   . Feeling of Stress : Not on file  Social Connections:   . Frequency of Communication with Friends and Family: Not on file  . Frequency of Social Gatherings with Friends and Family: Not on file  . Attends Religious Services: Not on file  . Active Member of Clubs or Organizations: Not on file  . Attends Banker Meetings: Not on file  . Marital Status: Not on file  Intimate Partner Violence:   . Fear of Current or Ex-Partner: Not on file  . Emotionally Abused: Not on file  . Physically Abused: Not on file  . Sexually Abused: Not on file     Review of Systems  All  other systems reviewed and are negative.      Objective:   Physical Exam        Assessment & Plan:  Fever, unspecified fever cause - Plan: SARS-COV-2 RNA,(COVID-19) QUAL NAAT  I recommended the patient drive to my office so that I can perform a Covid test.  Her symptoms are highly suspicious for COVID-19 particular given the fact that she has not had a vaccine and she is just recently traveled to [place].  Therefore the patient will be seen in our parking lot and I will swab her there.  I recommended that she quarantine for 10 days from symptom onset.  She would be able to return to work Wednesday of next week even if her test is positive.  However she should refrain from going to work.  Patient is low risk for complications.  Furthermore I do not have a positive test and I will likely not have a positive test before 10  days from symptom onset.  Therefore I will not refer the patient for Regeneron.  Did recommend vitamin C, vitamin D, and zinc.  Recommended rest and supportive care.  If she develops any shortness of breath she should go to the hospital immediately.  Pulse oximetry was 98% on room air.  Patient was seen in her vehicle in a parking lot.  She was in no respiratory distress with no shortness of breath.

## 2020-08-03 LAB — SARS-COV-2 RNA,(COVID-19) QUALITATIVE NAAT: SARS CoV2 RNA: DETECTED — CR

## 2020-08-05 ENCOUNTER — Telehealth: Payer: Self-pay | Admitting: Family Medicine

## 2020-08-05 NOTE — Telephone Encounter (Signed)
Melissa Spencer would like a return back to work letter for next week Monday 08/12/2020

## 2020-08-06 NOTE — Telephone Encounter (Signed)
Letter is ready for pick up once Ms. Armes comes out of quarantine.

## 2020-08-06 NOTE — Telephone Encounter (Signed)
Ok letter on my desk.

## 2020-08-09 ENCOUNTER — Other Ambulatory Visit: Payer: Self-pay

## 2020-08-09 ENCOUNTER — Telehealth (INDEPENDENT_AMBULATORY_CARE_PROVIDER_SITE_OTHER): Payer: 59 | Admitting: Family Medicine

## 2020-08-09 DIAGNOSIS — U071 COVID-19: Secondary | ICD-10-CM

## 2020-08-09 NOTE — Progress Notes (Signed)
Subjective:    Patient ID: Melissa Spencer, female    DOB: [birth year], 54 y.o.   MRN: [id number]  HPI  Patient is a very pleasant 54 year old Caucasian female who is being seen today as a telephone visit.  Patient is currently in a parking lot in her car.  I am in my office.  Phone call began at 1201.  Phone call concluded at 1211.  She tested positive for Covid last week.  Her symptoms began August 13.  I saw the patient on August 20.  Symptoms included fever, chills, headache, body ache, congestion, and a cough.  However her symptoms have now completely resolved.  It is 14 days since symptom onset.  Therefore she can clinically return to work.  However her employer requires a negative Covid test before people can return. Past Medical History:  Diagnosis Date  . 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 Hypertension    No past surgical history on file. Current Outpatient Medications on File Prior to Visit  Medication Sig Dispense Refill  . amLODipine (NORVASC) 5 MG tablet TAKE 1 TABLET BY MOUTH EVERY DAY 90 tablet 3  . hydrochlorothiazide (MICROZIDE) 12.5 MG capsule TAKE 1 CAPSULE BY MOUTH EVERY DAY 90 capsule 1   No current facility-administered medications on file prior to visit.   No Known Allergies Social History   Socioeconomic History  . Marital status: Single    Spouse name: Not on file  . Number of children: Not on file  . Years of education: Not on file  . Highest education level: Not on file  Occupational History  . Not on file  Tobacco Use  . Smoking status: Former Games developer  . Smokeless tobacco: Never Used  Substance and Sexual Activity  . Alcohol use: Yes    Comment: Occasional  . Drug use: No  . 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 Not on file  Social History Narrative  . Not on file   Social Determinants of Health   Financial Resource Strain:   . Difficulty of  Paying Living Expenses: Not on file  Food Insecurity:   . Worried About Programme researcher, broadcasting/film/video in the Last Year: Not on file  . Ran Out of Food in the Last Year: Not on file  Transportation Needs:   . Lack of Transportation (Medical): Not on file  . Lack of Transportation (Non-Medical): Not on file  Physical Activity:   . Days of Exercise per Week: Not on file  . Minutes of Exercise per Session: Not on file  Stress:   . Feeling of Stress : Not on file  Social Connections:   . Frequency of Communication with Friends and Family: Not on file  . Frequency of Social Gatherings with Friends and Family: Not on file  . Attends Religious Services: Not on file  . Active Member of Clubs or Organizations: Not on file  . Attends Banker Meetings: Not on file  . Marital Status: Not on file  Intimate Partner Violence:   . Fear of Current or Ex-Partner: Not on file  . Emotionally Abused: Not on file  . Physically Abused: Not on file  . Sexually Abused: Not on file     Review of Systems  All other systems reviewed and are negative.      Objective:   Physical Exam  Assessment & Plan:  COVID-19 - Plan: SARS-COV-2 RNA,(COVID-19) QUAL NAAT  Patient states that she is virtually back to normal.  She does have some mild fatigue otherwise she is doing well.  She denies any cough or chest pain or shortness of breath or persistent fevers.  She is completely asymptomatic.  I saw the patient in the parking lot after hanging up the telephone.  I performed another Covid test.  Results should be back Monday and if negative, the patient can certainly return to work without any restrictions.

## 2020-08-11 LAB — SARS-COV-2 RNA,(COVID-19) QUALITATIVE NAAT: SARS CoV2 RNA: DETECTED — CR

## 2020-08-16 ENCOUNTER — Other Ambulatory Visit: Payer: 59

## 2020-09-07 ENCOUNTER — Other Ambulatory Visit: Payer: Self-pay | Admitting: Family Medicine

## 2020-10-11 ENCOUNTER — Other Ambulatory Visit: Payer: Self-pay

## 2020-10-11 ENCOUNTER — Other Ambulatory Visit: Payer: 59

## 2020-10-11 DIAGNOSIS — E785 Hyperlipidemia, unspecified: Secondary | ICD-10-CM

## 2020-10-11 DIAGNOSIS — Z0000 Encounter for general adult medical examination without abnormal findings: Secondary | ICD-10-CM

## 2020-10-11 DIAGNOSIS — I10 Essential (primary) hypertension: Secondary | ICD-10-CM

## 2020-10-12 LAB — COMPLETE METABOLIC PANEL WITH GFR
AG Ratio: 2 (calc) (ref 1.0–2.5)
ALT: 13 U/L (ref 6–29)
AST: 13 U/L (ref 10–35)
Albumin: 4.8 g/dL (ref 3.6–5.1)
Alkaline phosphatase (APISO): 76 U/L (ref 37–153)
BUN: 16 mg/dL (ref 7–25)
CO2: 26 mmol/L (ref 20–32)
Calcium: 10.1 mg/dL (ref 8.6–10.4)
Chloride: 105 mmol/L (ref 98–110)
Creat: 0.69 mg/dL (ref 0.50–1.05)
GFR, Est African American: 114 mL/min/{1.73_m2} (ref >=60)
GFR, Est Non African American: 99 mL/min/{1.73_m2} (ref >=60)
Globulin: 2.4 g/dL (calc) (ref 1.9–3.7)
Glucose, Bld: 102 mg/dL — ABNORMAL HIGH (ref 65–99)
Potassium: 4.9 mmol/L (ref 3.5–5.3)
Sodium: 143 mmol/L (ref 135–146)
Total Bilirubin: 0.4 mg/dL (ref 0.2–1.2)
Total Protein: 7.2 g/dL (ref 6.1–8.1)

## 2020-10-12 LAB — CBC WITH DIFFERENTIAL/PLATELET
Absolute Monocytes: 551 cells/uL (ref 200–950)
Basophils Absolute: 41 cells/uL (ref 0–200)
Basophils Relative: 0.6 %
Eosinophils Absolute: 143 cells/uL (ref 15–500)
Eosinophils Relative: 2.1 %
HCT: 39.7 % (ref 35.0–45.0)
Hemoglobin: 13.6 g/dL (ref 11.7–15.5)
Lymphs Abs: 2536 cells/uL (ref 850–3900)
MCH: 31.9 pg (ref 27.0–33.0)
MCHC: 34.3 g/dL (ref 32.0–36.0)
MCV: 93.2 fL (ref 80.0–100.0)
MPV: 10.7 fL (ref 7.5–12.5)
Monocytes Relative: 8.1 %
Neutro Abs: 3529 cells/uL (ref 1500–7800)
Neutrophils Relative %: 51.9 %
Platelets: 352 10*3/uL (ref 140–400)
RBC: 4.26 10*6/uL (ref 3.80–5.10)
RDW: 12.1 % (ref 11.0–15.0)
Total Lymphocyte: 37.3 %
WBC: 6.8 10*3/uL (ref 3.8–10.8)

## 2020-10-12 LAB — LIPID PANEL
Cholesterol: 245 mg/dL — ABNORMAL HIGH (ref <200)
HDL: 67 mg/dL (ref >=50)
LDL Cholesterol (Calc): 145 mg/dL (calc) — ABNORMAL HIGH
Non-HDL Cholesterol (Calc): 178 mg/dL (calc) — ABNORMAL HIGH (ref <130)
Total CHOL/HDL Ratio: 3.7 (calc) (ref <5.0)
Triglycerides: 193 mg/dL — ABNORMAL HIGH (ref <150)

## 2020-10-12 LAB — TSH: TSH: 1.57 mIU/L

## 2020-10-15 ENCOUNTER — Ambulatory Visit (INDEPENDENT_AMBULATORY_CARE_PROVIDER_SITE_OTHER): Payer: 59 | Admitting: Family Medicine

## 2020-10-15 ENCOUNTER — Other Ambulatory Visit: Payer: Self-pay

## 2020-10-15 VITALS — BP 130/80 | HR 89 | Temp 98.1°F | Ht 63.0 in | Wt 131.0 lb

## 2020-10-15 DIAGNOSIS — E785 Hyperlipidemia, unspecified: Secondary | ICD-10-CM

## 2020-10-15 DIAGNOSIS — Z0000 Encounter for general adult medical examination without abnormal findings: Secondary | ICD-10-CM

## 2020-10-15 DIAGNOSIS — I10 Essential (primary) hypertension: Secondary | ICD-10-CM

## 2020-10-15 DIAGNOSIS — Z0001 Encounter for general adult medical examination with abnormal findings: Secondary | ICD-10-CM

## 2020-10-15 NOTE — Progress Notes (Signed)
Subjective:    Patient ID: Melissa Spencer, female    DOB: 1966-06-03, 54 y.o.   MRN: [id number]  HPI Patient is a very pleasant 54 year old white female who presents today for a general medical exam.  Patient sees a gynecologist annually.  Her Pap smear is up-to-date.  She had a mammogram earlier this year.  She declines the flu shot.  She declines a tetanus shot.  We discussed the Covid vaccine.  She had Covid earlier this year and she would like to get a booster 3 months after her initial infection.  We also discussed the shingles vaccine and she will consider this.  She had a Cologuard in 2019.  She is not due for repeat Cologuard until 2022.  Her most recent lab work is listed below.  I calculated her 10-year risk of cardiovascular disease to be 2.6% based on her cholesterol below. Lab on 10/11/2020  Component Date Value Ref Range Status  . Glucose, Bld 10/11/2020 102* 65 - 99 mg/dL Final   Comment: .            Fasting reference interval . For someone without known diabetes, a glucose value between 100 and 125 mg/dL is consistent with prediabetes and should be confirmed with a follow-up test. .   . BUN 10/11/2020 16  7 - 25 mg/dL Final  . Creat 09/32/3557 0.69  0.50 - 1.05 mg/dL Final   Comment: For patients >72 years of age, the reference limit for Creatinine is approximately 13% higher for people identified as African-American. .   . GFR, Est Non African American 10/11/2020 99  > OR = 60 mL/min/1.8m2 Final  . GFR, Est African American 10/11/2020 114  > OR = 60 mL/min/1.27m2 Final  . BUN/Creatinine Ratio 10/11/2020 NOT APPLICABLE  6 - 22 (calc) Final  . Sodium 10/11/2020 143  135 - 146 mmol/L Final  . Potassium 10/11/2020 4.9  3.5 - 5.3 mmol/L Final  . Chloride 10/11/2020 105  98 - 110 mmol/L Final  . CO2 10/11/2020 26  20 - 32 mmol/L Final  . Calcium 10/11/2020 10.1  8.6 - 10.4 mg/dL Final  . Total Protein 10/11/2020 7.2  6.1 - 8.1 g/dL Final  . Albumin 32/20/2542 4.8  3.6  - 5.1 g/dL Final  . Globulin 70/62/3762 2.4  1.9 - 3.7 g/dL (calc) Final  . AG Ratio 10/11/2020 2.0  1.0 - 2.5 (calc) Final  . Total Bilirubin 10/11/2020 0.4  0.2 - 1.2 mg/dL Final  . Alkaline phosphatase (APISO) 10/11/2020 76  37 - 153 U/L Final  . AST 10/11/2020 13  10 - 35 U/L Final  . ALT 10/11/2020 13  6 - 29 U/L Final  . Cholesterol 10/11/2020 245* <200 mg/dL Final  . HDL 83/15/1761 67  > OR = 50 mg/dL Final  . Triglycerides 10/11/2020 193* <150 mg/dL Final  . LDL Cholesterol (Calc) 10/11/2020 145* mg/dL (calc) Final   Comment: Reference range: <100 . Desirable range <100 mg/dL for primary prevention;   <70 mg/dL for patients with CHD or diabetic patients  with > or = 2 CHD risk factors. Marland Kitchen LDL-C is now calculated using the Martin-Hopkins  calculation, which is a validated novel method providing  better accuracy than the Friedewald equation in the  estimation of LDL-C.  Horald Pollen et al. Lenox Ahr. 6073;710(62): 2061-2068  (http://education.[facility].com/faq/FAQ164)   . Total CHOL/HDL Ratio 10/11/2020 3.7  <6.9 (calc) Final  . Non-HDL Cholesterol (Calc) 10/11/2020 178* <130 mg/dL (calc) Final   Comment:  For patients with diabetes plus 1 major ASCVD risk  factor, treating to a non-HDL-C goal of <100 mg/dL  (LDL-C of <22 mg/dL) is considered a therapeutic  option.   Marland Kitchen TSH 10/11/2020 1.57  mIU/L Final   Comment:           Reference Range .           > or = 20 Years  0.40-4.50 .                Pregnancy Ranges           First trimester    0.26-2.66           Second trimester   0.55-2.73           Third trimester    0.43-2.91   . WBC 10/11/2020 6.8  3.8 - 10.8 Thousand/uL Final  . RBC 10/11/2020 4.26  3.80 - 5.10 Million/uL Final  . Hemoglobin 10/11/2020 13.6  11.7 - 15.5 g/dL Final  . HCT 97/98/9211 39.7  35 - 45 % Final  . MCV 10/11/2020 93.2  80.0 - 100.0 fL Final  . MCH 10/11/2020 31.9  27.0 - 33.0 pg Final  . MCHC 10/11/2020 34.3  32.0 - 36.0 g/dL Final  . RDW  94/17/4081 12.1  11.0 - 15.0 % Final  . Platelets 10/11/2020 352  140 - 400 Thousand/uL Final  . MPV 10/11/2020 10.7  7.5 - 12.5 fL Final  . Neutro Abs 10/11/2020 3,529  1,500 - 7,800 cells/uL Final  . Lymphs Abs 10/11/2020 2,536  850 - 3,900 cells/uL Final  . Absolute Monocytes 10/11/2020 551  200 - 950 cells/uL Final  . Eosinophils Absolute 10/11/2020 143  15.0 - 500.0 cells/uL Final  . Basophils Absolute 10/11/2020 41  0.0 - 200.0 cells/uL Final  . Neutrophils Relative % 10/11/2020 51.9  % Final  . Total Lymphocyte 10/11/2020 37.3  % Final  . Monocytes Relative 10/11/2020 8.1  % Final  . Eosinophils Relative 10/11/2020 2.1  % Final  . Basophils Relative 10/11/2020 0.6  % Final    Past Medical History:  Diagnosis Date  . 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 Hypertension    No past surgical history on file. Current Outpatient Medications on File Prior to Visit  Medication Sig Dispense Refill  . amLODipine (NORVASC) 5 MG tablet TAKE 1 TABLET BY MOUTH EVERY DAY 90 tablet 3  . hydrochlorothiazide (MICROZIDE) 12.5 MG capsule TAKE 1 CAPSULE BY MOUTH EVERY DAY **[facility] NOT CONTRACTED WITH INSURANCE** 90 capsule 1   No current facility-administered medications on file prior to visit.   No Known Allergies Social History   Socioeconomic History  . Marital status: Single    Spouse name: Not on file  . Number of children: Not on file  . Years of education: Not on file  . Highest education level: Not on file  Occupational History  . Not on file  Tobacco Use  . Smoking status: Former Games developer  . Smokeless tobacco: Never Used  Substance and Sexual Activity  . Alcohol use: Yes    Comment: Occasional  . Drug use: No  . 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 Not on file  Social History Narrative  . Not on file   Social Determinants of Health   Financial Resource Strain:   .  Difficulty of Paying Living Expenses: Not  on file  Food Insecurity:   . Worried About Programme researcher, broadcasting/film/video in the Last Year: Not on file  . Ran Out of Food in the Last Year: Not on file  Transportation Needs:   . Lack of Transportation (Medical): Not on file  . Lack of Transportation (Non-Medical): Not on file  Physical Activity:   . Days of Exercise per Week: Not on file  . Minutes of Exercise per Session: Not on file  Stress:   . Feeling of Stress : Not on file  Social Connections:   . Frequency of Communication with Friends and Family: Not on file  . Frequency of Social Gatherings with Friends and Family: Not on file  . Attends Religious Services: Not on file  . Active Member of Clubs or Organizations: Not on file  . Attends Banker Meetings: Not on file  . Marital Status: Not on file  Intimate Partner Violence:   . Fear of Current or Ex-Partner: Not on file  . Emotionally Abused: Not on file  . Physically Abused: Not on file  . Sexually Abused: Not on file   Family History  Problem Relation Age of Onset  . Hyperlipidemia Mother   . Hypertension Mother   . Hyperlipidemia Father   . Heart disease Father       Review of Systems  All other systems reviewed and are negative.      Objective:   Physical Exam Vitals reviewed.  Constitutional:      General: She is not in acute distress.    Appearance: She is well-developed. She is not diaphoretic.  HENT:     Head: Normocephalic and atraumatic.     Right Ear: External ear normal.     Left Ear: External ear normal.     Nose: Nose normal.     Mouth/Throat:     Pharynx: No oropharyngeal exudate.  Eyes:     General: No scleral icterus.       Right eye: No discharge.        Left eye: No discharge.     Conjunctiva/sclera: Conjunctivae normal.     Pupils: Pupils are equal, round, and reactive to light.  Neck:     Thyroid: No thyromegaly.     Vascular: No JVD.     Trachea: No tracheal deviation.    Cardiovascular:     Rate and Rhythm: Normal rate and regular rhythm.     Heart sounds: Normal heart sounds. No murmur heard.  No friction rub. No gallop.   Pulmonary:     Effort: Pulmonary effort is normal. No respiratory distress.     Breath sounds: Normal breath sounds. No stridor. No wheezing or rales.  Chest:     Chest wall: No tenderness.  Abdominal:     General: Bowel sounds are normal. There is no distension.     Palpations: Abdomen is soft. There is no mass.     Tenderness: There is no abdominal tenderness. There is no guarding or rebound.  Musculoskeletal:        General: No tenderness. Normal range of motion.     Cervical back: Normal range of motion and neck supple.  Lymphadenopathy:     Cervical: No cervical adenopathy.  Skin:    General: Skin is warm.     Coloration: Skin is not pale.     Findings: No erythema or rash.  Neurological:     Mental Status: She is alert and oriented to person, place, and  time.     Cranial Nerves: No cranial nerve deficit.     Motor: No abnormal muscle tone.     Coordination: Coordination normal.     Deep Tendon Reflexes: Reflexes are normal and symmetric.  Psychiatric:        Behavior: Behavior normal.        Thought Content: Thought content normal.        Judgment: Judgment normal.           Assessment & Plan:  Routine general medical examination at a health care facility  Essential hypertension  Hyperlipidemia, unspecified hyperlipidemia type  Patient's blood pressure is well controlled.  Although her LDL cholesterol is elevated, her 10-year risk of cardiovascular disease does not warrant a statin.  Her mammogram is up-to-date.  Her Pap smear is date.  She is due for Cologuard next year or a colonoscopy next year.  I recommended a flu shot but she politely declined.  I recommended the Covid booster 3 months after her initial infection.  We discussed the shingles vaccine and the tetanus shot and she politely declined in both.   Otherwise lab work is excellent except for a mildly elevated fasting blood sugar.  We discussed a low carbohydrate diet.

## 2020-12-01 ENCOUNTER — Other Ambulatory Visit: Payer: Self-pay | Admitting: Family Medicine

## 2020-12-19 ENCOUNTER — Encounter: Payer: Self-pay | Admitting: Family Medicine

## 2020-12-19 ENCOUNTER — Other Ambulatory Visit: Payer: Self-pay

## 2020-12-19 ENCOUNTER — Ambulatory Visit (INDEPENDENT_AMBULATORY_CARE_PROVIDER_SITE_OTHER): Payer: 59 | Admitting: Family Medicine

## 2020-12-19 VITALS — BP 134/66 | HR 82 | Temp 97.8°F | Resp 14 | Ht 63.0 in | Wt 131.0 lb

## 2020-12-19 DIAGNOSIS — Z20822 Contact with and (suspected) exposure to covid-19: Secondary | ICD-10-CM | POA: Diagnosis not present

## 2020-12-19 NOTE — Progress Notes (Signed)
   Subjective:    Patient ID: Melissa Spencer, female    DOB: 1966-07-11, 55 y.o.   MRN: [id number]  HPI Patient is a very pleasant 55 year old Caucasian female who has a history of recent Covid infection in October which was likely the delta variant.  6 days ago she was exposed at work to a coworker who has tested positive for Covid.  The patient is completely asymptomatic.  She denies any runny nose chills fever chest pain shortness of breath cough or body aches.  However she has to have a negative PCR test before she can return to work Past Medical History:  Diagnosis Date  . 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 Hypertension    History reviewed. No pertinent surgical history. Current Outpatient Medications on File Prior to Visit  Medication Sig Dispense Refill  . amLODipine (NORVASC) 5 MG tablet TAKE 1 TABLET BY MOUTH EVERY DAY 90 tablet 3  . hydrochlorothiazide (MICROZIDE) 12.5 MG capsule TAKE 1 CAPSULE BY MOUTH EVERY DAY **[facility] NOT CONTRACTED WITH INSURANCE** 90 capsule 1   No current facility-administered medications on file prior to visit.   No Known Allergies Social History   Socioeconomic History  . Marital status: Single    Spouse name: Not on file  . Number of children: Not on file  . Years of education: Not on file  . Highest education level: Not on file  Occupational History  . Not on file  Tobacco Use  . Smoking status: Former Games developer  . Smokeless tobacco: Never Used  Substance and Sexual Activity  . Alcohol use: Yes    Comment: Occasional  . Drug use: No  . 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 Not on file  Social History Narrative  . Not on file   Social Determinants of Health   Financial Resource Strain: Not on file  Food Insecurity: Not on file  Transportation Needs: Not on file  Physical Activity: Not on file  Stress: Not on file  Social Connections:  Not on file  Intimate Partner Violence: Not on file      Review of Systems  All other systems reviewed and are negative.      Objective:   Physical Exam Vitals reviewed.  Cardiovascular:     Rate and Rhythm: Normal rate and regular rhythm.     Heart sounds: Normal heart sounds.  Pulmonary:     Effort: Pulmonary effort is normal. No respiratory distress.     Breath sounds: Normal breath sounds. No wheezing or rales.  Abdominal:     General: Bowel sounds are normal.     Palpations: Abdomen is soft.           Assessment & Plan:  Close exposure to COVID-19 virus - Plan: SARS-COV-2 RNA,(COVID-19) QUAL NAAT  Patient was screened with a PCR Covid test.  Await the results.

## 2020-12-22 LAB — SARS-COV-2 RNA,(COVID-19) QUALITATIVE NAAT: SARS CoV2 RNA: NOT DETECTED

## 2020-12-23 ENCOUNTER — Telehealth: Payer: Self-pay

## 2020-12-23 NOTE — Telephone Encounter (Signed)
Patient called for lab results of covid test, patient also wanted copy printed for her records

## 2020-12-27 ENCOUNTER — Encounter: Payer: Self-pay | Admitting: *Deleted

## 2021-04-02 ENCOUNTER — Telehealth: Payer: Self-pay | Admitting: Family Medicine

## 2021-04-02 NOTE — Telephone Encounter (Signed)
Received fax from [facility] [address] [facility] requesting refill on hydrochlorothiazide (MICROZIDE) 12.5 MG capsule

## 2021-04-03 MED ORDER — HYDROCHLOROTHIAZIDE 12.5 MG PO CAPS: ORAL_CAPSULE | ORAL | 1 refills | Status: DC

## 2021-04-07 LAB — HM MAMMOGRAPHY

## 2021-06-11 ENCOUNTER — Telehealth: Payer: Self-pay | Admitting: *Deleted

## 2021-06-11 NOTE — Telephone Encounter (Signed)
Patient due for Cologuard re-screen.   Order placed via [employer].   Cologuard (Order [id number])

## 2021-09-19 ENCOUNTER — Other Ambulatory Visit: Payer: Self-pay | Admitting: Family Medicine

## 2021-12-16 ENCOUNTER — Other Ambulatory Visit: Payer: Self-pay

## 2021-12-16 MED ORDER — AMLODIPINE BESYLATE 5 MG PO TABS: 5.0000 mg | ORAL_TABLET | Freq: Every day | ORAL | 0 refills | Status: DC

## 2022-01-19 ENCOUNTER — Other Ambulatory Visit: Payer: Self-pay

## 2022-01-19 MED ORDER — HYDROCHLOROTHIAZIDE 12.5 MG PO CAPS: 12.5000 mg | ORAL_CAPSULE | Freq: Every day | ORAL | 1 refills | Status: DC

## 2022-03-15 ENCOUNTER — Other Ambulatory Visit: Payer: Self-pay | Admitting: Family Medicine

## 2022-05-07 DIAGNOSIS — Z124 Encounter for screening for malignant neoplasm of cervix: Secondary | ICD-10-CM | POA: Diagnosis not present

## 2022-05-07 DIAGNOSIS — Z1231 Encounter for screening mammogram for malignant neoplasm of breast: Secondary | ICD-10-CM | POA: Diagnosis not present

## 2022-05-07 DIAGNOSIS — Z01419 Encounter for gynecological examination (general) (routine) without abnormal findings: Secondary | ICD-10-CM | POA: Diagnosis not present

## 2022-05-07 LAB — HM MAMMOGRAPHY

## 2022-05-14 LAB — HM PAP SMEAR: HPV, high-risk: NEGATIVE

## 2022-06-13 ENCOUNTER — Other Ambulatory Visit: Payer: Self-pay | Admitting: Family Medicine

## 2022-06-15 NOTE — Telephone Encounter (Signed)
Requested medications are due for refill today.  yes  Requested medications are on the active medications list.  yes  Last refill. 03/16/2022 #90 0 refills  Future visit scheduled.   no  Notes to clinic.  Pt is more than 3 months overdue for OV.    Requested Prescriptions  Pending Prescriptions Disp Refills   amLODipine (NORVASC) 5 MG tablet [Pharmacy Med Name: AMLODIPINE BESYLATE 5 MG TAB] 90 tablet 0    Sig: TAKE 1 TABLET (5 MG TOTAL) BY MOUTH DAILY.     Cardiovascular: Calcium Channel Blockers 2 Failed - 06/13/2022  1:20 AM      Failed - Valid encounter within last 6 months    Recent Outpatient Visits           1 year ago Close exposure to COVID-19 virus   [facility] Pickard, Priscille Heidelberg, MD   1 year ago Routine general medical examination at a health care facility   [facility] Pickard, Priscille Heidelberg, MD   1 year ago COVID-19   [facility] Tanya Nones, Priscille Heidelberg, MD   1 year ago Fever, unspecified fever cause   [facility] Tanya Nones Priscille Heidelberg, MD   3 years ago Routine general medical examination at a health care facility   [facility] Pickard, Priscille Heidelberg, MD              Passed - Last BP in normal range    BP Readings from Last 1 Encounters:  12/19/20 134/66         Passed - Last Heart Rate in normal range    Pulse Readings from Last 1 Encounters:  12/19/20 82

## 2022-06-15 NOTE — Telephone Encounter (Signed)
Called pt to schedule appt LMOM.

## 2022-06-22 ENCOUNTER — Other Ambulatory Visit: Payer: Self-pay | Admitting: Family Medicine

## 2022-06-23 NOTE — Telephone Encounter (Signed)
Requested medication (s) are due for refill today: yes  Requested medication (s) are on the active medication list: yes  Last refill:  01/19/22 #90 with 1 RF  Future visit scheduled: 07/02/22  Notes to clinic:  Failed protocol of labs within 12 months, has upcoming appt, please assess.       Requested Prescriptions  Pending Prescriptions Disp Refills   hydrochlorothiazide (MICROZIDE) 12.5 MG capsule [Pharmacy Med Name: HYDROCHLOROTHIAZIDE 12.5 MG CP] 90 capsule 1    Sig: TAKE 1 CAPSULE BY MOUTH EVERY DAY     Cardiovascular: Diuretics - Thiazide Failed - 06/22/2022  8:39 PM      Failed - Cr in normal range and within 180 days    Creat  Date Value Ref Range Status  10/11/2020 0.69 0.50 - 1.05 mg/dL Final    Comment:    For patients >72 years of age, the reference limit for Creatinine is approximately 13% higher for people identified as African-American. .          Failed - K in normal range and within 180 days    Potassium  Date Value Ref Range Status  10/11/2020 4.9 3.5 - 5.3 mmol/L Final         Failed - Na in normal range and within 180 days    Sodium  Date Value Ref Range Status  10/11/2020 143 135 - 146 mmol/L Final         Failed - Valid encounter within last 6 months    Recent Outpatient Visits           1 year ago Close exposure to COVID-19 virus   [facility] Pickard, Priscille Heidelberg, MD   1 year ago Routine general medical examination at a health care facility   [facility] Pickard, Priscille Heidelberg, MD   1 year ago COVID-19   [facility] Tanya Nones, Priscille Heidelberg, MD   1 year ago Fever, unspecified fever cause   [facility] Tanya Nones Priscille Heidelberg, MD   3 years ago Routine general medical examination at a health care facility   [facility] Pickard, Priscille Heidelberg, MD       Future Appointments             In 1 week Tanya Nones Priscille Heidelberg, MD [facility]            Passed -  Last BP in normal range    BP Readings from Last 1 Encounters:  12/19/20 134/66

## 2022-06-25 NOTE — Telephone Encounter (Signed)
Pt called in inquiring about a refill of this med amLODipine (NORVASC) 5 MG tablet [[id number]. Pt states that she is out of this med and will need a courtesy refill sent to pharmacy enough to last until her scheduled ov on 07/02/22. Please advise.  Cb#: [phone]

## 2022-06-26 ENCOUNTER — Other Ambulatory Visit: Payer: Self-pay

## 2022-06-26 MED ORDER — AMLODIPINE BESYLATE 5 MG PO TABS: 5.0000 mg | ORAL_TABLET | Freq: Every day | ORAL | 0 refills | Status: DC

## 2022-07-02 ENCOUNTER — Ambulatory Visit (INDEPENDENT_AMBULATORY_CARE_PROVIDER_SITE_OTHER): Payer: BC Managed Care – PPO | Admitting: Family Medicine

## 2022-07-02 VITALS — BP 118/70 | HR 89 | Temp 98.9°F | Ht 65.0 in | Wt 126.4 lb

## 2022-07-02 DIAGNOSIS — I10 Essential (primary) hypertension: Secondary | ICD-10-CM | POA: Diagnosis not present

## 2022-07-02 DIAGNOSIS — Z1211 Encounter for screening for malignant neoplasm of colon: Secondary | ICD-10-CM

## 2022-07-02 NOTE — Progress Notes (Signed)
Subjective:    Patient ID: Melissa Spencer, female    DOB: [birth year], 56 y.o.   MRN: [id number]  HPI Patient is here today to follow-up hypertension.  Her blood pressure is well controlled at 118/70 on combination of amlodipine and hydrochlorothiazide.  She denies any chest pain shortness of breath or dyspnea on exertion.  Her mammogram and Pap smear performed at her gynecologist.  Her last Cologuard was 4 years ago.  She is due for this today.  Otherwise she is doing well.  She declines the shingles vaccine.   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 Current Outpatient Medications on File Prior to Visit  Medication Sig Dispense Refill   amLODipine (NORVASC) 5 MG tablet Take 1 tablet (5 mg total) by mouth daily. 90 tablet 0   hydrochlorothiazide (MICROZIDE) 12.5 MG capsule TAKE 1 CAPSULE BY MOUTH EVERY DAY 90 capsule 1   No current facility-administered medications on file prior to visit.   No Known Allergies Social History   Socioeconomic History   Marital status: Single    Spouse name: Not on file   Number of children: Not on file   Years of education: Not on file   Highest education level: Not on file  Occupational History   Not on file  Tobacco Use   Smoking status: Former   Smokeless tobacco: Never  Substance and Sexual Activity   Alcohol use: Yes    Comment: Occasional   Drug use: No   Sexual activity: Yes    Comment: married to policeman, daughter died in MVA  Other Topics Concern   Not on file  Social History Narrative   Not on file   Social Determinants of Health   Financial Resource Strain: Not on file  Food Insecurity: Not on file  Transportation Needs: Not on file  Physical Activity: Not on file  Stress: Not on file  Social Connections: Not on file  Intimate Partner Violence: Not on file   Family History  Problem Relation Age  of Onset   Hyperlipidemia Mother    Hypertension Mother    Hyperlipidemia Father    Heart disease Father       Review of Systems  All other systems reviewed and are negative.      Objective:   Physical Exam Vitals reviewed.  Constitutional:      General: She is not in acute distress.    Appearance: She is well-developed. She is not diaphoretic.  HENT:     Head: Normocephalic and atraumatic.     Right Ear: External ear normal.     Left Ear: External ear normal.     Nose: Nose normal.     Mouth/Throat:     Pharynx: No oropharyngeal exudate.  Eyes:     General: No scleral icterus.       Right eye: No discharge.        Left eye: No discharge.     Conjunctiva/sclera: Conjunctivae normal.     Pupils: Pupils are equal, round, and reactive to light.  Neck:     Thyroid: No thyromegaly.     Vascular: No JVD.     Trachea: No tracheal deviation.  Cardiovascular:     Rate and Rhythm: Normal rate and regular rhythm.     Heart sounds: Normal heart sounds. No murmur heard.  No friction rub. No gallop.  Pulmonary:     Effort: Pulmonary effort is normal. No respiratory distress.     Breath sounds: Normal breath sounds. No stridor. No wheezing or rales.  Chest:     Chest wall: No tenderness.  Abdominal:     General: Bowel sounds are normal. There is no distension.     Palpations: Abdomen is soft. There is no mass.     Tenderness: There is no abdominal tenderness. There is no guarding or rebound.  Musculoskeletal:        General: No tenderness. Normal range of motion.     Cervical back: Normal range of motion and neck supple.  Lymphadenopathy:     Cervical: No cervical adenopathy.  Skin:    General: Skin is warm.     Coloration: Skin is not pale.     Findings: No erythema or rash.  Neurological:     Mental Status: She is alert and oriented to person, place, and time.     Cranial Nerves: No cranial nerve deficit.     Motor: No abnormal muscle tone.     Coordination:  Coordination normal.     Deep Tendon Reflexes: Reflexes are normal and symmetric.  Psychiatric:        Behavior: Behavior normal.        Thought Content: Thought content normal.        Judgment: Judgment normal.           Assessment & Plan:  Colon cancer screening - Plan: Cologuard  Benign essential HTN - Plan: CBC with Differential/Platelet, Lipid panel, COMPLETE METABOLIC PANEL WITH GFR Patient's blood pressure is outstanding.  Check CBC CMP and lipid panel.  Recommended Cologuard and I will order this for her.  Also recommended the shingles vaccine which she declines at the present time

## 2022-07-03 ENCOUNTER — Other Ambulatory Visit: Payer: Self-pay

## 2022-07-03 LAB — COMPLETE METABOLIC PANEL WITH GFR
AG Ratio: 2.1 (calc) (ref 1.0–2.5)
ALT: 16 U/L (ref 6–29)
AST: 15 U/L (ref 10–35)
Albumin: 5 g/dL (ref 3.6–5.1)
Alkaline phosphatase (APISO): 61 U/L (ref 37–153)
BUN: 15 mg/dL (ref 7–25)
CO2: 28 mmol/L (ref 20–32)
Calcium: 10 mg/dL (ref 8.6–10.4)
Chloride: 102 mmol/L (ref 98–110)
Creat: 0.74 mg/dL (ref 0.50–1.03)
Globulin: 2.4 g/dL (calc) (ref 1.9–3.7)
Glucose, Bld: 107 mg/dL — ABNORMAL HIGH (ref 65–99)
Potassium: 4.4 mmol/L (ref 3.5–5.3)
Sodium: 140 mmol/L (ref 135–146)
Total Bilirubin: 0.4 mg/dL (ref 0.2–1.2)
Total Protein: 7.4 g/dL (ref 6.1–8.1)
eGFR: 95 mL/min/{1.73_m2} (ref >=60)

## 2022-07-03 LAB — CBC WITH DIFFERENTIAL/PLATELET
Absolute Monocytes: 502 cells/uL (ref 200–950)
Basophils Absolute: 40 cells/uL (ref 0–200)
Basophils Relative: 0.7 %
Eosinophils Absolute: 103 cells/uL (ref 15–500)
Eosinophils Relative: 1.8 %
HCT: 39.7 % (ref 35.0–45.0)
Hemoglobin: 13.5 g/dL (ref 11.7–15.5)
Lymphs Abs: 2086 cells/uL (ref 850–3900)
MCH: 31.7 pg (ref 27.0–33.0)
MCHC: 34 g/dL (ref 32.0–36.0)
MCV: 93.2 fL (ref 80.0–100.0)
MPV: 10.2 fL (ref 7.5–12.5)
Monocytes Relative: 8.8 %
Neutro Abs: 2970 cells/uL (ref 1500–7800)
Neutrophils Relative %: 52.1 %
Platelets: 342 10*3/uL (ref 140–400)
RBC: 4.26 10*6/uL (ref 3.80–5.10)
RDW: 12 % (ref 11.0–15.0)
Total Lymphocyte: 36.6 %
WBC: 5.7 10*3/uL (ref 3.8–10.8)

## 2022-07-03 LAB — LIPID PANEL
Cholesterol: 245 mg/dL — ABNORMAL HIGH (ref <200)
HDL: 69 mg/dL (ref >=50)
LDL Cholesterol (Calc): 141 mg/dL (calc) — ABNORMAL HIGH
Non-HDL Cholesterol (Calc): 176 mg/dL (calc) — ABNORMAL HIGH (ref <130)
Total CHOL/HDL Ratio: 3.6 (calc) (ref <5.0)
Triglycerides: 211 mg/dL — ABNORMAL HIGH (ref <150)

## 2022-07-03 MED ORDER — ROSUVASTATIN CALCIUM 10 MG PO TABS: 10.0000 mg | ORAL_TABLET | Freq: Every day | ORAL | 3 refills | Status: DC

## 2022-07-12 DIAGNOSIS — Z1211 Encounter for screening for malignant neoplasm of colon: Secondary | ICD-10-CM | POA: Diagnosis not present

## 2022-07-21 LAB — COLOGUARD: COLOGUARD: NEGATIVE

## 2022-09-20 ENCOUNTER — Other Ambulatory Visit: Payer: Self-pay | Admitting: Family Medicine

## 2022-09-30 DIAGNOSIS — D225 Melanocytic nevi of trunk: Secondary | ICD-10-CM | POA: Diagnosis not present

## 2022-09-30 DIAGNOSIS — B001 Herpesviral vesicular dermatitis: Secondary | ICD-10-CM | POA: Diagnosis not present

## 2022-09-30 DIAGNOSIS — L814 Other melanin hyperpigmentation: Secondary | ICD-10-CM | POA: Diagnosis not present

## 2022-09-30 DIAGNOSIS — L821 Other seborrheic keratosis: Secondary | ICD-10-CM | POA: Diagnosis not present

## 2022-10-09 ENCOUNTER — Other Ambulatory Visit: Payer: BC Managed Care – PPO

## 2022-10-09 DIAGNOSIS — E785 Hyperlipidemia, unspecified: Secondary | ICD-10-CM

## 2022-10-09 DIAGNOSIS — I10 Essential (primary) hypertension: Secondary | ICD-10-CM

## 2022-10-10 LAB — CBC WITH DIFFERENTIAL/PLATELET
Absolute Monocytes: 554 {cells}/uL (ref 200–950)
Basophils Absolute: 43 {cells}/uL (ref 0–200)
Basophils Relative: 0.6 %
Eosinophils Absolute: 64 {cells}/uL (ref 15–500)
Eosinophils Relative: 0.9 %
HCT: 37.4 % (ref 35.0–45.0)
Hemoglobin: 12.9 g/dL (ref 11.7–15.5)
Lymphs Abs: 2222 {cells}/uL (ref 850–3900)
MCH: 31.2 pg (ref 27.0–33.0)
MCHC: 34.5 g/dL (ref 32.0–36.0)
MCV: 90.3 fL (ref 80.0–100.0)
MPV: 10.8 fL (ref 7.5–12.5)
Monocytes Relative: 7.8 %
Neutro Abs: 4217 {cells}/uL (ref 1500–7800)
Neutrophils Relative %: 59.4 %
Platelets: 304 Thousand/uL (ref 140–400)
RBC: 4.14 Million/uL (ref 3.80–5.10)
RDW: 11.5 % (ref 11.0–15.0)
Total Lymphocyte: 31.3 %
WBC: 7.1 Thousand/uL (ref 3.8–10.8)

## 2022-10-10 LAB — COMPLETE METABOLIC PANEL WITHOUT GFR
AG Ratio: 2.1 (calc) (ref 1.0–2.5)
ALT: 16 U/L (ref 6–29)
AST: 16 U/L (ref 10–35)
Albumin: 4.9 g/dL (ref 3.6–5.1)
Alkaline phosphatase (APISO): 61 U/L (ref 37–153)
BUN: 15 mg/dL (ref 7–25)
CO2: 29 mmol/L (ref 20–32)
Calcium: 10.2 mg/dL (ref 8.6–10.4)
Chloride: 102 mmol/L (ref 98–110)
Creat: 0.76 mg/dL (ref 0.50–1.03)
Globulin: 2.3 g/dL (ref 1.9–3.7)
Glucose, Bld: 115 mg/dL — ABNORMAL HIGH (ref 65–99)
Potassium: 4.4 mmol/L (ref 3.5–5.3)
Sodium: 140 mmol/L (ref 135–146)
Total Bilirubin: 0.7 mg/dL (ref 0.2–1.2)
Total Protein: 7.2 g/dL (ref 6.1–8.1)
eGFR: 92 mL/min/1.73m2 (ref >=60)

## 2022-10-10 LAB — LIPID PANEL
Cholesterol: 142 mg/dL (ref <200)
HDL: 77 mg/dL (ref >=50)
LDL Cholesterol (Calc): 44 mg/dL (calc)
Non-HDL Cholesterol (Calc): 65 mg/dL (calc) (ref <130)
Total CHOL/HDL Ratio: 1.8 (calc) (ref <5.0)
Triglycerides: 119 mg/dL (ref <150)

## 2022-12-20 ENCOUNTER — Other Ambulatory Visit: Payer: Self-pay | Admitting: Family Medicine

## 2022-12-21 ENCOUNTER — Telehealth: Payer: Self-pay | Admitting: Family Medicine

## 2022-12-21 ENCOUNTER — Other Ambulatory Visit: Payer: Self-pay

## 2022-12-21 DIAGNOSIS — I10 Essential (primary) hypertension: Secondary | ICD-10-CM

## 2022-12-21 MED ORDER — HYDROCHLOROTHIAZIDE 12.5 MG PO CAPS: ORAL_CAPSULE | ORAL | 3 refills | Status: DC

## 2022-12-21 NOTE — Telephone Encounter (Signed)
Prescription Request  12/21/2022  Is this a "Controlled Substance" medicine? No  LOV: 07/02/2022  What is the name of the medication or equipment?   hydrochlorothiazide (MICROZIDE) 12.5 MG capsule   Have you contacted your pharmacy to request a refill? Yes   Which pharmacy would you like this sent to?  [id number] [address], [place] [address] [address] Phone: [phone] Fax: [phone]    Patient notified that their request is being sent to the clinical staff for review and that they should receive a response within 2 business days.   Please advise pharmacist at [phone].

## 2022-12-21 NOTE — Telephone Encounter (Signed)
Requested Prescriptions  Pending Prescriptions Disp Refills   amLODipine (NORVASC) 5 MG tablet [Pharmacy Med Name: AMLODIPINE BESYLATE 5 MG TAB] 90 tablet 1    Sig: TAKE 1 TABLET (5 MG TOTAL) BY MOUTH DAILY.     Cardiovascular: Calcium Channel Blockers 2 Failed - 12/20/2022  1:04 AM      Failed - Valid encounter within last 6 months    Recent Outpatient Visits           2 years ago Close exposure to COVID-19 virus   [place] Pickard, [name], MD   2 years ago Routine general medical examination at a health care facility   [place], [name], MD   2 years ago [place] [name], [name], MD   2 years ago Fever, unspecified fever cause   [place] [name] [name], MD   3 years ago Routine general medical examination at a health care facility   [place], [name], MD              Passed - Last BP in normal range    BP Readings from Last 1 Encounters:  07/02/22 118/70         Passed - Last Heart Rate in normal range    Pulse Readings from Last 1 Encounters:  07/02/22 89

## 2023-01-25 ENCOUNTER — Other Ambulatory Visit: Payer: Self-pay | Admitting: Family Medicine

## 2023-01-25 DIAGNOSIS — Z1231 Encounter for screening mammogram for malignant neoplasm of breast: Secondary | ICD-10-CM

## 2023-02-15 ENCOUNTER — Encounter: Payer: Self-pay | Admitting: Family Medicine

## 2023-03-08 ENCOUNTER — Other Ambulatory Visit: Payer: Self-pay | Admitting: Family Medicine

## 2023-03-08 DIAGNOSIS — I10 Essential (primary) hypertension: Secondary | ICD-10-CM

## 2023-03-08 NOTE — Telephone Encounter (Signed)
Prescription Request  03/08/2023  LOV: 07/02/2022  What is the name of the medication or equipment?   hydrochlorothiazide (MICROZIDE) 12.5 MG capsule   Have you contacted your pharmacy to request a refill? Yes   Which pharmacy would you like this sent to?  [id number] [address], [place] [address] [place] [address] Phone: [phone] Fax: [phone]    Patient notified that their request is being sent to the clinical staff for review and that they should receive a response within 2 business days.   Please advise pharmacist at [phone].

## 2023-03-09 NOTE — Telephone Encounter (Signed)
Rx 12/21/22 #30 3RF- too soon Requested Prescriptions  Pending Prescriptions Disp Refills   hydrochlorothiazide (MICROZIDE) 12.5 MG capsule 30 capsule 3    Sig: TAKE 1 CAPSULE BY MOUTH EVERY DAY     Cardiovascular: Diuretics - Thiazide Failed - 03/08/2023 12:11 PM      Failed - Valid encounter within last 6 months    Recent Outpatient Visits           2 years ago Close exposure to COVID-19 virus   [place] [name], MD   2 years ago Routine general medical examination at a health care facility   [place], [name], MD   2 years ago [place] [name], [name], MD   2 years ago Fever, unspecified fever cause   [place] [name], MD   4 years ago Routine general medical examination at a health care facility   [place], [name], MD              Passed - Cr in normal range and within 180 days    Creat  Date Value Ref Range Status  10/09/2022 0.76 0.50 - 1.03 mg/dL Final         Passed - K in normal range and within 180 days    Potassium  Date Value Ref Range Status  10/09/2022 4.4 3.5 - 5.3 mmol/L Final         Passed - Na in normal range and within 180 days    Sodium  Date Value Ref Range Status  10/09/2022 140 135 - 146 mmol/L Final         Passed - Last BP in normal range    BP Readings from Last 1 Encounters:  07/02/22 118/70

## 2023-03-11 ENCOUNTER — Telehealth: Payer: Self-pay | Admitting: Family Medicine

## 2023-03-11 NOTE — Telephone Encounter (Signed)
Left message to return call; need to schedule med refill appt.

## 2023-03-11 NOTE — Telephone Encounter (Signed)
Refill/new script request received from pharmacy for hydrochlorothiazide (MICROZIDE) 12.5 MG capsule   Awaiting call back from patient to schedule appt.

## 2023-03-19 NOTE — Telephone Encounter (Signed)
Erroneous encounter. Please disregard.

## 2023-03-29 ENCOUNTER — Encounter: Payer: Self-pay | Admitting: Family Medicine

## 2023-03-29 ENCOUNTER — Ambulatory Visit: Payer: BC Managed Care – PPO | Admitting: Family Medicine

## 2023-03-29 VITALS — BP 122/72 | HR 78 | Temp 98.6°F | Ht 65.0 in | Wt 126.2 lb

## 2023-03-29 DIAGNOSIS — I10 Essential (primary) hypertension: Secondary | ICD-10-CM | POA: Diagnosis not present

## 2023-03-29 DIAGNOSIS — E7800 Pure hypercholesterolemia, unspecified: Secondary | ICD-10-CM | POA: Diagnosis not present

## 2023-03-29 MED ORDER — AMLODIPINE BESYLATE 5 MG PO TABS: 5.0000 mg | ORAL_TABLET | Freq: Every day | ORAL | 1 refills | Status: DC

## 2023-03-29 MED ORDER — HYDROCHLOROTHIAZIDE 12.5 MG PO CAPS: ORAL_CAPSULE | ORAL | 3 refills | Status: DC

## 2023-03-29 MED ORDER — ROSUVASTATIN CALCIUM 10 MG PO TABS: 10.0000 mg | ORAL_TABLET | Freq: Every day | ORAL | 3 refills | Status: DC

## 2023-03-29 NOTE — Progress Notes (Signed)
Subjective:    Patient ID: KAIJAH ABTS, female    DOB: [birth year], 57 y.o.   MRN: [id number]  HPI Patient is here today to follow-up hypertension.  Her blood pressure is outstanding today 122/72.  She is currently taking amlodipine and hydrochlorothiazide.  She denies any complications from the medication.  She denies any palpitations or irregular heartbeats.  She denies any chest pain or shortness of breath.  Recently we started her on Crestor for hyperlipidemia.  In October of last year we checked her cholesterol and it improved dramatically.  She denies any myalgias on statin.  Her last blood work did show an elevated fasting blood sugar 115.  She does have a history of type 2 diabetes in her aunt.  She denies drinking soda or juices.  She does not eat a lot of carbs.  She exercises daily.  She is not overweight.  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 Current Outpatient Medications on File Prior to Visit  Medication Sig Dispense Refill   amLODipine (NORVASC) 5 MG tablet Take 1 tablet (5 mg total) by mouth daily. 90 tablet 1   hydrochlorothiazide (MICROZIDE) 12.5 MG capsule TAKE 1 CAPSULE BY MOUTH EVERY DAY 30 capsule 3   rosuvastatin (CRESTOR) 10 MG tablet Take 1 tablet (10 mg total) by mouth daily. 90 tablet 3   No current facility-administered medications on file prior to visit.   No Known Allergies Social History   Socioeconomic History   Marital status: Single    Spouse name: Not on file   Number of children: Not on file   Years of education: Not on file   Highest education level: Not on file  Occupational History   Not on file  Tobacco Use   Smoking status: Former   Smokeless tobacco: Never  Substance and Sexual Activity   Alcohol use: Yes    Comment: Occasional   Drug use: No   Sexual activity: Yes    Comment: married to policeman,  daughter died in MVA  Other Topics Concern   Not on file  Social History Narrative   Not on file   Social Determinants of Health   Financial Resource Strain: Patient Declined (03/25/2023)   Overall Financial Resource Strain (CARDIA)    Difficulty of Paying Living Expenses: Patient declined  Food Insecurity: Patient Declined (03/25/2023)   Hunger Vital Sign    Worried About Running Out of Food in the Last Year: Patient declined    Ran Out of Food in the Last Year: Patient declined  Transportation Needs: Patient Declined (03/25/2023)   PRAPARE - Administrator, Civil Service (Medical): Patient declined    Lack of Transportation (Non-Medical): Patient declined  Physical Activity: Unknown (03/25/2023)   Exercise Vital Sign    Days of Exercise per Week: Patient declined    Minutes of Exercise per Session: Not on file  Stress: Not on file  Social Connections: Unknown (03/25/2023)   Social Connection and Isolation Panel [NHANES]    Frequency of Communication with Friends and Family: Patient declined    Frequency of Social Gatherings with Friends and Family: Patient declined    Attends Religious Services: Patient declined    Database administrator or Organizations: Patient declined    Attends Banker Meetings: Not on file    Marital Status: Divorced  Intimate Partner Violence: Not on file   Family History  Problem Relation Age of Onset   Hyperlipidemia Mother    Hypertension Mother    Hyperlipidemia Father    Heart disease Father       Review of Systems  All other systems reviewed and are negative.      Objective:   Physical Exam Vitals reviewed.  Constitutional:      General: She is not in acute distress.    Appearance: She is well-developed. She is not diaphoretic.  HENT:     Head: Normocephalic and atraumatic.     Right Ear: External ear normal.     Left Ear: External ear normal.     Nose: Nose normal.     Mouth/Throat:     Pharynx: No  oropharyngeal exudate.  Eyes:     General: No scleral icterus.       Right eye: No discharge.        Left eye: No discharge.     Conjunctiva/sclera: Conjunctivae normal.     Pupils: Pupils are equal, round, and reactive to light.  Neck:     Thyroid: No thyromegaly.     Vascular: No JVD.     Trachea: No tracheal deviation.  Cardiovascular:     Rate and Rhythm: Normal rate and regular rhythm.     Heart sounds: Normal heart sounds. No murmur heard.    No friction rub. No gallop.  Pulmonary:     Effort: Pulmonary effort is normal. No respiratory distress.     Breath sounds: Normal breath sounds. No stridor. No wheezing or rales.  Chest:     Chest wall: No tenderness.  Abdominal:     General: Bowel sounds are normal. There is no distension.     Palpations: Abdomen is soft. There is no mass.     Tenderness: There is no abdominal tenderness. There is no guarding or rebound.  Musculoskeletal:        General: No tenderness. Normal range of motion.     Cervical back: Normal range of motion and neck supple.  Lymphadenopathy:     Cervical: No cervical adenopathy.  Skin:    General: Skin is warm.     Coloration: Skin is not pale.     Findings: No erythema or rash.  Neurological:     Mental Status: She is alert and oriented to person, place, and time.     Cranial Nerves: No cranial nerve deficit.     Motor: No abnormal muscle tone.     Coordination: Coordination normal.     Deep Tendon Reflexes: Reflexes are normal and symmetric.  Psychiatric:        Behavior: Behavior normal.        Thought Content: Thought content normal.        Judgment: Judgment normal.           Assessment & Plan:  Benign essential HTN - Plan: CBC with Differential/Platelet, COMPLETE METABOLIC PANEL WITH GFR, Lipid panel, hydrochlorothiazide (MICROZIDE) 12.5 MG capsule, DISCONTINUED: hydrochlorothiazide (MICROZIDE) 12.5 MG capsule  Pure hypercholesterolemia - Plan: CBC with Differential/Platelet, COMPLETE  METABOLIC PANEL WITH GFR, Lipid panel I will check a fasting blood work.  Very happy with her blood pressure.  Her cholesterol last time was outstanding.  I am going to watch closely her blood sugar.  If elevated at this time we will get a hemoglobin A1c.  We discussed a low carbohydrate diet and aerobic exercise but the patient is already  doing those things on her own.  This may be an underlying genetic issue for her.  Did recommend reducing consumption of starches which may be her only indulgence

## 2023-03-30 LAB — CBC WITH DIFFERENTIAL/PLATELET
Absolute Monocytes: 435 cells/uL (ref 200–950)
Basophils Absolute: 32 cells/uL (ref 0–200)
Basophils Relative: 0.5 %
Eosinophils Absolute: 83 cells/uL (ref 15–500)
Eosinophils Relative: 1.3 %
HCT: 40.1 % (ref 35.0–45.0)
Hemoglobin: 13.2 g/dL (ref 11.7–15.5)
Lymphs Abs: 2086 cells/uL (ref 850–3900)
MCH: 30.3 pg (ref 27.0–33.0)
MCHC: 32.9 g/dL (ref 32.0–36.0)
MCV: 92.2 fL (ref 80.0–100.0)
MPV: 10.6 fL (ref 7.5–12.5)
Monocytes Relative: 6.8 %
Neutro Abs: 3763 cells/uL (ref 1500–7800)
Neutrophils Relative %: 58.8 %
Platelets: 327 10*3/uL (ref 140–400)
RBC: 4.35 10*6/uL (ref 3.80–5.10)
RDW: 11.8 % (ref 11.0–15.0)
Total Lymphocyte: 32.6 %
WBC: 6.4 10*3/uL (ref 3.8–10.8)

## 2023-03-30 LAB — LIPID PANEL
Cholesterol: 141 mg/dL (ref <200)
HDL: 85 mg/dL (ref >=50)
LDL Cholesterol (Calc): 40 mg/dL (calc)
Non-HDL Cholesterol (Calc): 56 mg/dL (calc) (ref <130)
Total CHOL/HDL Ratio: 1.7 (calc) (ref <5.0)
Triglycerides: 82 mg/dL (ref <150)

## 2023-03-30 LAB — COMPLETE METABOLIC PANEL WITH GFR
AG Ratio: 1.9 (calc) (ref 1.0–2.5)
ALT: 14 U/L (ref 6–29)
AST: 12 U/L (ref 10–35)
Albumin: 4.8 g/dL (ref 3.6–5.1)
Alkaline phosphatase (APISO): 72 U/L (ref 37–153)
BUN: 14 mg/dL (ref 7–25)
CO2: 26 mmol/L (ref 20–32)
Calcium: 9.9 mg/dL (ref 8.6–10.4)
Chloride: 105 mmol/L (ref 98–110)
Creat: 0.76 mg/dL (ref 0.50–1.03)
Globulin: 2.5 g/dL (calc) (ref 1.9–3.7)
Glucose, Bld: 104 mg/dL — ABNORMAL HIGH (ref 65–99)
Potassium: 4.6 mmol/L (ref 3.5–5.3)
Sodium: 144 mmol/L (ref 135–146)
Total Bilirubin: 0.4 mg/dL (ref 0.2–1.2)
Total Protein: 7.3 g/dL (ref 6.1–8.1)
eGFR: 91 mL/min/{1.73_m2} (ref >=60)

## 2023-04-20 ENCOUNTER — Encounter: Payer: Self-pay | Admitting: Family Medicine

## 2023-04-29 ENCOUNTER — Encounter: Payer: Self-pay | Admitting: *Deleted

## 2023-04-29 ENCOUNTER — Ambulatory Visit: Payer: BC Managed Care – PPO | Admitting: *Deleted

## 2023-04-29 NOTE — Patient Instructions (Signed)
Visit Information  Thank you for taking time to visit with me today. Please don't hesitate to contact me if I can be of assistance to you.   Following are the goals we discussed today:   Goals Addressed             This Visit's Progress    COMPLETED: Assess Need for Social Work Involvement.   On track    Care Coordination Interventions:   Interventions Today    Flowsheet Row Most Recent Value  Chronic Disease   Chronic disease during today's visit Hypertension (HTN), Other  [Degenerative Lumbar Disc Disease]  General Interventions   General Interventions Discussed/Reviewed General Interventions Discussed, Labs, Vaccines, Doctor Visits, General Interventions Reviewed, Annual Eye Exam, [facility], Health Screening  [Encouraged]  Labs Hgb A1c annually  [Encouraged]  Vaccines COVID-19, Flu, Pneumonia, RSV, Shingles, Tetanus/Pertussis/Diphtheria  [Encouraged]  Doctor Visits Discussed/Reviewed PCP, Annual Wellness Visits, Doctor Visits Reviewed, Doctor Visits Discussed, Specialist  [Encouraged]  Health Screening Bone Density, Colonoscopy, Mammogram  [Encouraged]  PCP/Specialist Visits Compliance with follow-up visit  [Encouraged]  Exercise Interventions   Exercise Discussed/Reviewed Exercise Discussed, Exercise Reviewed, Physical Activity, Weight Managment  [Encouraged]  Physical Activity Discussed/Reviewed Physical Activity Discussed, Home Exercise Program (HEP), Physical Activity Reviewed, Types of exercise  [Encouraged]  Weight Management Weight maintenance  [Encouraged]  Education Interventions   Education Provided Provided Education  Provided Verbal Education On Nutrition, Mental Health/Coping with Illness, When to see the doctor, Eye Care, Applications, Exercise, Medication, Insurance Plans, [facility], [employer]  [Encouraged]  Mental Health Interventions   Mental Health Discussed/Reviewed Mental Health Discussed, Anxiety, Depression, Grief and Loss, Mental Health  Reviewed, Substance Abuse, Coping Strategies, Crisis, Other, Suicide  [Domestic Violence]  Nutrition Interventions   Nutrition Discussed/Reviewed Supplmental nutrition, Decreasing salt, Decreasing fats, Increaing proteins, Nutrition Discussed, Adding fruits and vegetables, Nutrition Reviewed, Fluid intake, Carbohydrate meal planning, Portion sizes, Decreasing sugar intake  [Encouraged]  Pharmacy Interventions   Pharmacy Dicussed/Reviewed Pharmacy Topics Discussed, Pharmacy Topics Reviewed, Medication Adherence, Affording Medications  [Encouraged]  Safety Interventions   Safety Discussed/Reviewed Safety Discussed, Safety Reviewed  [Encouraged]  Advanced Directive Interventions   Advanced Directives Discussed/Reviewed Advanced Directives Discussed  [Encouraged Completion]     Danford Bad, BSW, MSW, LCSW  Licensed Clinical Social Worker  [facility]  Mailing [place] [address], [place], [place] [address] Physical Address-[address] [place], [place] [address] Toll Free Main # [phone] Fax # [phone] Cell # [phone] [email or web address]      Please call the care guide team at [phone] if you need to cancel or reschedule your appointment.   If you are experiencing a Mental Health or Behavioral Health Crisis or need someone to talk to, please call the Suicide and Crisis Lifeline: 988 call the [place] National Suicide Prevention Lifeline: 631-611-4111 or TTY: (952)321-4184 TTY (639)732-9876) to talk to a trained counselor call 1-800-273-TALK (toll free, 24 hour hotline) go to [facility] [address] [place] [phone]) call the [facility] Crisis Line: [phone] call 911  Patient verbalizes understanding of instructions and care plan provided today and agrees to view in MyChart. Active MyChart status and patient understanding of how to access instructions  and care plan via MyChart confirmed with patient.     No further follow up required.  Danford Bad, BSW, MSW, LCSW  Licensed Restaurant manager, [facility]  Mailing [place] [address] [place], [place] [address]  Physical Address[address] [place], [place] [address] Toll Free Main # [phone] Fax # [phone] Cell # [phone] [email or web address]

## 2023-04-29 NOTE — Patient Outreach (Signed)
Care Coordination   Initial Visit Note   04/29/2023  Name: Melissa Spencer MRN: [id number] DOB: 06-20-1966  Melissa Spencer is a 57 y.o. year old female who sees Pickard, Priscille Heidelberg, MD for primary care. I spoke with Melissa Spencer by phone today.  What matters to the patients health and wellness today?  No interventions identified.  Patient denies need for social work involvement at this time.   Goals Addressed             This Visit's Progress    COMPLETED: Assess Need for Social Work Involvement.   On track    Care Coordination Interventions:   Interventions Today    Flowsheet Row Most Recent Value  Chronic Disease   Chronic disease during today's visit Hypertension (HTN), Other  [Degenerative Lumbar Disc Disease]  General Interventions   General Interventions Discussed/Reviewed General Interventions Discussed, Labs, Vaccines, Doctor Visits, General Interventions Reviewed, Annual Eye Exam, [facility], Health Screening  [Encouraged]  Labs Hgb A1c annually  [Encouraged]  Vaccines COVID-19, Flu, Pneumonia, RSV, Shingles, Tetanus/Pertussis/Diphtheria  [Encouraged]  Doctor Visits Discussed/Reviewed PCP, Annual Wellness Visits, Doctor Visits Reviewed, Doctor Visits Discussed, Specialist  [Encouraged]  Health Screening Bone Density, Colonoscopy, Mammogram  [Encouraged]  PCP/Specialist Visits Compliance with follow-up visit  [Encouraged]  Exercise Interventions   Exercise Discussed/Reviewed Exercise Discussed, Exercise Reviewed, Physical Activity, Weight Managment  [Encouraged]  Physical Activity Discussed/Reviewed Physical Activity Discussed, Home Exercise Program (HEP), Physical Activity Reviewed, Types of exercise  [Encouraged]  Weight Management Weight maintenance  [Encouraged]  Education Interventions   Education Provided Provided Education  Provided Verbal Education On Nutrition, Mental Health/Coping with Illness, When to see the doctor, Eye Care, Applications,  Exercise, Medication, Insurance Plans, [facility], [employer]  [Encouraged]  Mental Health Interventions   Mental Health Discussed/Reviewed Mental Health Discussed, Anxiety, Depression, Grief and Loss, Mental Health Reviewed, Substance Abuse, Coping Strategies, Crisis, Other, Suicide  [Domestic Violence]  Nutrition Interventions   Nutrition Discussed/Reviewed Supplmental nutrition, Decreasing salt, Decreasing fats, Increaing proteins, Nutrition Discussed, Adding fruits and vegetables, Nutrition Reviewed, Fluid intake, Carbohydrate meal planning, Portion sizes, Decreasing sugar intake  [Encouraged]  Pharmacy Interventions   Pharmacy Dicussed/Reviewed Pharmacy Topics Discussed, Pharmacy Topics Reviewed, Medication Adherence, Affording Medications  [Encouraged]  Safety Interventions   Safety Discussed/Reviewed Safety Discussed, Safety Reviewed  [Encouraged]  Advanced Directive Interventions   Advanced Directives Discussed/Reviewed Advanced Directives Discussed  [Encouraged Completion]     Danford Bad, BSW, MSW, LCSW  Licensed Clinical Social Worker  [facility]  Mailing [place] [address], [place], [place] [address] Physical Address[address], [place], [place] [address] Toll Free Main # [phone] Fax # [phone] Cell # [phone] [email or web address]        SDOH assessments and interventions completed:  Yes.  SDOH Interventions Today    Flowsheet Row Most Recent Value  SDOH Interventions   Food Insecurity Interventions Intervention Not Indicated  Housing Interventions Intervention Not Indicated  Transportation Interventions Intervention Not Indicated, Patient Resources (Friends/Family)  Utilities Interventions Intervention Not Indicated  Alcohol Usage Interventions Intervention Not Indicated (Score <7)  Financial Strain Interventions Intervention Not Indicated  Physical Activity Interventions  Intervention Not Indicated  Stress Interventions Intervention Not Indicated  Social Connections Interventions Intervention Not Indicated     Care Coordination Interventions:  No, not indicated.   Follow up plan: No further intervention required.   Encounter Outcome:  Pt. Visit Completed.   Danford Bad, BSW, MSW, [employer]  Licensed Visual merchandiser  [place]  Mailing [place] [address], [place], [place] [address] Physical Address[address], [place], [place] [address] Toll Free Main # [phone] Fax # [phone] Cell # [phone] [email or web address]

## 2023-05-31 ENCOUNTER — Ambulatory Visit: Payer: BC Managed Care – PPO | Admitting: Family Medicine

## 2023-05-31 ENCOUNTER — Encounter: Payer: Self-pay | Admitting: Family Medicine

## 2023-05-31 VITALS — BP 112/64 | HR 77 | Temp 98.3°F | Ht 65.0 in | Wt 126.6 lb

## 2023-05-31 DIAGNOSIS — R599 Enlarged lymph nodes, unspecified: Secondary | ICD-10-CM | POA: Diagnosis not present

## 2023-05-31 NOTE — Progress Notes (Signed)
Subjective:    Patient ID: Melissa Spencer, female    DOB: 11-13-1966, 57 y.o.   MRN: [id number]  HPI Patient is here today for a swollen glands underneath her left jaw.  She recently had her teeth cleaned.  She states that the dental assistant was being very aggressive and she had significant work done around the gumline's.  The next day she noticed a swollen lymph node on the left side of her neck.  She denies any fevers or chills.  She denies any weight loss.  She denies any other lymphadenopathy otherwise she feels fine.  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 Current Outpatient Medications on File Prior to Visit  Medication Sig Dispense Refill   amLODipine (NORVASC) 5 MG tablet Take 1 tablet (5 mg total) by mouth daily. 90 tablet 1   hydrochlorothiazide (MICROZIDE) 12.5 MG capsule TAKE 1 CAPSULE BY MOUTH EVERY DAY 90 capsule 3   rosuvastatin (CRESTOR) 10 MG tablet Take 1 tablet (10 mg total) by mouth daily. 90 tablet 3   No current facility-administered medications on file prior to visit.   No Known Allergies Social History   Socioeconomic History   Marital status: Divorced    Spouse name: Not on file   Number of children: 1   Years of education: 12   Highest education level: 12th grade  Occupational History   Not on file  Tobacco Use   Smoking status: Former    Passive exposure: Past   Smokeless tobacco: Never  Vaping Use   Vaping Use: Never used  Substance and Sexual Activity   Alcohol use: Yes    Comment: Occasional   Drug use: No   Sexual activity: Yes  Other Topics Concern   Not on file  Social History Narrative   Not on file   Social Determinants of Health   Financial Resource Strain: Low Risk  (04/29/2023)   Overall Financial Resource Strain  (CARDIA)    Difficulty of Paying Living Expenses: Not hard at all  Food Insecurity: No Food Insecurity (04/29/2023)   Hunger Vital Sign    Worried About Running Out of Food in the Last Year: Never true    Ran Out of Food in the Last Year: Never true  Transportation Needs: No Transportation Needs (04/29/2023)   PRAPARE - Administrator, Civil Service (Medical): No    Lack of Transportation (Non-Medical): No  Physical Activity: Sufficiently Active (04/29/2023)   Exercise Vital Sign    Days of Exercise per Week: 5 days    Minutes of Exercise per Session: 30 min  Stress: No Stress Concern Present (04/29/2023)   [employer] of Occupational Health - Occupational Stress Questionnaire    Feeling of Stress : Not at all  Social Connections: Moderately Isolated (04/29/2023)   Social Connection and Isolation Panel [NHANES]    Frequency of Communication with Friends and Family: More than three times a week    Frequency of Social Gatherings with Friends and Family: More than three times a week    Attends Religious Services: Never    Database administrator or Organizations: Yes    Attends Engineer, structural: More than 4 times per year    Marital  Status: Divorced  Catering manager Violence: Not At Risk (04/29/2023)   Humiliation, Afraid, Rape, and Kick questionnaire    Fear of Current or Ex-Partner: No    Emotionally Abused: No    Physically Abused: No    Sexually Abused: No   Family History  Problem Relation Age of Onset   Hyperlipidemia Mother    Hypertension Mother    Hyperlipidemia Father    Heart disease Father       Review of Systems  All other systems reviewed and are negative.      Objective:   Physical Exam Vitals reviewed.  Constitutional:      General: She is not in acute distress.    Appearance: She is well-developed. She is not diaphoretic.  HENT:     Head: Normocephalic and atraumatic.     Right Ear: External ear normal.     Left Ear:  External ear normal.     Nose: Nose normal.     Mouth/Throat:     Pharynx: No oropharyngeal exudate.  Eyes:     General: No scleral icterus.       Right eye: No discharge.        Left eye: No discharge.     Conjunctiva/sclera: Conjunctivae normal.     Pupils: Pupils are equal, round, and reactive to light.  Neck:     Thyroid: No thyromegaly.     Vascular: No JVD.     Trachea: No tracheal deviation.  Cardiovascular:     Rate and Rhythm: Normal rate and regular rhythm.     Heart sounds: Normal heart sounds. No murmur heard.    No friction rub. No gallop.  Pulmonary:     Effort: Pulmonary effort is normal. No respiratory distress.     Breath sounds: Normal breath sounds. No stridor. No wheezing or rales.  Chest:     Chest wall: No tenderness.  Abdominal:     General: Bowel sounds are normal. There is no distension.     Palpations: Abdomen is soft. There is no mass.     Tenderness: There is no abdominal tenderness. There is no guarding or rebound.  Musculoskeletal:     Cervical back: Normal range of motion and neck supple.  Lymphadenopathy:     Cervical: Cervical adenopathy present.     Right cervical: No deep or posterior cervical adenopathy.    Left cervical: Superficial cervical adenopathy present. No deep or posterior cervical adenopathy.     Upper Body:     Right upper body: No supraclavicular or axillary adenopathy.     Left upper body: No supraclavicular or axillary adenopathy.     Lower Body: No right inguinal adenopathy. No left inguinal adenopathy.  Skin:    General: Skin is warm.     Coloration: Skin is not pale.     Findings: No erythema or rash.  Neurological:     Mental Status: She is alert and oriented to person, place, and time.     Cranial Nerves: No cranial nerve deficit.     Motor: No abnormal muscle tone.     Coordination: Coordination normal.     Deep Tendon Reflexes: Reflexes are normal and symmetric.  Psychiatric:        Behavior: Behavior normal.         Thought Content: Thought content normal.        Judgment: Judgment normal.           Assessment & Plan:  Enlarged lymph node -  Plan: CBC with Differential/Platelet, COMPLETE METABOLIC PANEL WITH GFR, Sedimentation rate I suspect that this is a reactive lymph node due to her recent dental work.  Check a CBC, CMP, and sedimentation rate.  We agreed to monitor this lymph node for the next 2 to 4 weeks.  If enlarging, ultrasound

## 2023-06-01 LAB — COMPLETE METABOLIC PANEL WITH GFR
AG Ratio: 2.1 (calc) (ref 1.0–2.5)
ALT: 14 U/L (ref 6–29)
AST: 15 U/L (ref 10–35)
Albumin: 4.9 g/dL (ref 3.6–5.1)
Alkaline phosphatase (APISO): 71 U/L (ref 37–153)
BUN: 16 mg/dL (ref 7–25)
CO2: 31 mmol/L (ref 20–32)
Calcium: 10.2 mg/dL (ref 8.6–10.4)
Chloride: 101 mmol/L (ref 98–110)
Creat: 0.85 mg/dL (ref 0.50–1.03)
Globulin: 2.3 g/dL (calc) (ref 1.9–3.7)
Glucose, Bld: 109 mg/dL — ABNORMAL HIGH (ref 65–99)
Potassium: 3.7 mmol/L (ref 3.5–5.3)
Sodium: 142 mmol/L (ref 135–146)
Total Bilirubin: 0.3 mg/dL (ref 0.2–1.2)
Total Protein: 7.2 g/dL (ref 6.1–8.1)
eGFR: 80 mL/min/{1.73_m2} (ref >=60)

## 2023-06-01 LAB — CBC WITH DIFFERENTIAL/PLATELET
Absolute Monocytes: 640 cells/uL (ref 200–950)
Basophils Absolute: 41 cells/uL (ref 0–200)
Basophils Relative: 0.5 %
Eosinophils Absolute: 98 cells/uL (ref 15–500)
Eosinophils Relative: 1.2 %
HCT: 38.8 % (ref 35.0–45.0)
Hemoglobin: 13.1 g/dL (ref 11.7–15.5)
Lymphs Abs: 2485 cells/uL (ref 850–3900)
MCH: 30.8 pg (ref 27.0–33.0)
MCHC: 33.8 g/dL (ref 32.0–36.0)
MCV: 91.3 fL (ref 80.0–100.0)
MPV: 10.3 fL (ref 7.5–12.5)
Monocytes Relative: 7.8 %
Neutro Abs: 4936 cells/uL (ref 1500–7800)
Neutrophils Relative %: 60.2 %
Platelets: 323 10*3/uL (ref 140–400)
RBC: 4.25 10*6/uL (ref 3.80–5.10)
RDW: 11.9 % (ref 11.0–15.0)
Total Lymphocyte: 30.3 %
WBC: 8.2 10*3/uL (ref 3.8–10.8)

## 2023-06-01 LAB — SEDIMENTATION RATE: Sed Rate: 2 mm/h (ref 0–30)

## 2023-06-24 DIAGNOSIS — Z01419 Encounter for gynecological examination (general) (routine) without abnormal findings: Secondary | ICD-10-CM | POA: Diagnosis not present

## 2023-06-24 DIAGNOSIS — E7800 Pure hypercholesterolemia, unspecified: Secondary | ICD-10-CM | POA: Insufficient documentation

## 2023-06-24 DIAGNOSIS — Z1231 Encounter for screening mammogram for malignant neoplasm of breast: Secondary | ICD-10-CM | POA: Diagnosis not present

## 2023-06-24 LAB — HM MAMMOGRAPHY

## 2023-06-25 ENCOUNTER — Telehealth: Payer: Self-pay | Admitting: Family Medicine

## 2023-06-25 NOTE — Telephone Encounter (Signed)
Erroneous encounter. Please disregard.

## 2023-09-13 ENCOUNTER — Ambulatory Visit: Payer: BC Managed Care – PPO | Admitting: Family Medicine

## 2023-09-13 ENCOUNTER — Encounter: Payer: Self-pay | Admitting: Family Medicine

## 2023-09-13 VITALS — BP 132/82 | HR 84 | Temp 98.3°F | Ht 65.0 in | Wt 128.6 lb

## 2023-09-13 DIAGNOSIS — J40 Bronchitis, not specified as acute or chronic: Secondary | ICD-10-CM

## 2023-09-13 MED ORDER — PREDNISONE 20 MG PO TABS
ORAL_TABLET | ORAL | 0 refills | Status: DC
Start: 2023-09-13 — End: 2024-04-11

## 2023-09-13 NOTE — Progress Notes (Signed)
Subjective:    Patient ID: Melissa Spencer, female    DOB: Mar 16, 1966, 57 y.o.   MRN: [id number]  Cough  2 weeks ago, the patient had COVID.  She has been dealing with a hacking barking cough ever since.  She is mildly short of breath at night.  Her pulse oximetry is 94% today on room air.  She does hear herself wheeze at time.  She denies any chest pain or pleurisy or tinnitus.  She fever since.  She denies any sinus pain or sinus headache.  Denies any postnasal drip.  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 Current Outpatient Medications on File Prior to Visit  Medication Sig Dispense Refill   amLODipine (NORVASC) 5 MG tablet Take 1 tablet (5 mg total) by mouth daily. 90 tablet 1   hydrochlorothiazide (MICROZIDE) 12.5 MG capsule TAKE 1 CAPSULE BY MOUTH EVERY DAY 90 capsule 3   rosuvastatin (CRESTOR) 10 MG tablet Take 1 tablet (10 mg total) by mouth daily. 90 tablet 3   No current facility-administered medications on file prior to visit.   No Known Allergies Social History   Socioeconomic History   Marital status: Divorced    Spouse name: Not on file   Number of children: 1   Years of education: 12   Highest education level: 12th grade  Occupational History   Not on file  Tobacco Use   Smoking status: Former    Passive exposure: Past   Smokeless tobacco: Never  Vaping Use   Vaping status: Never Used  Substance and Sexual Activity   Alcohol use: Yes    Comment: Occasional   Drug use: No   Sexual activity: Yes  Other Topics Concern   Not on file  Social History Narrative   Not on file   Social Determinants of Health   Financial Resource Strain: Low Risk  (04/29/2023)   Overall Financial Resource Strain (CARDIA)    Difficulty of Paying Living Expenses: Not hard at all  Food Insecurity: No Food Insecurity (04/29/2023)    Hunger Vital Sign    Worried About Running Out of Food in the Last Year: Never true    Ran Out of Food in the Last Year: Never true  Transportation Needs: No Transportation Needs (04/29/2023)   PRAPARE - Administrator, Civil Service (Medical): No    Lack of Transportation (Non-Medical): No  Physical Activity: Sufficiently Active (04/29/2023)   Exercise Vital Sign    Days of Exercise per Week: 5 days    Minutes of Exercise per Session: 30 min  Stress: No Stress Concern Present (04/29/2023)   [employer] of Occupational Health - Occupational Stress Questionnaire    Feeling of Stress : Not at all  Social Connections: Moderately Isolated (04/29/2023)   Social Connection and Isolation Panel [NHANES]    Frequency of Communication with Friends and Family: More than three times a week    Frequency of Social Gatherings with Friends and Family: More than three times a week    Attends Religious Services: Never    Database administrator or Organizations: Yes    Attends Engineer, structural: More than 4 times per year    Marital Status: Divorced  Intimate Partner Violence: Not At Risk (04/29/2023)   Humiliation, Afraid, Rape, and Kick  questionnaire    Fear of Current or Ex-Partner: No    Emotionally Abused: No    Physically Abused: No    Sexually Abused: No   Family History  Problem Relation Age of Onset   Hyperlipidemia Mother    Hypertension Mother    Hyperlipidemia Father    Heart disease Father       Review of Systems  Respiratory:  Positive for cough.   All other systems reviewed and are negative.      Objective:   Physical Exam Vitals reviewed.  Constitutional:      General: She is not in acute distress.    Appearance: She is well-developed. She is not diaphoretic.  HENT:     Head: Normocephalic and atraumatic.     Right Ear: Tympanic membrane, ear canal and external ear normal.     Left Ear: Tympanic membrane, ear canal and external ear normal.      Nose: Nose normal. No congestion or rhinorrhea.     Mouth/Throat:     Pharynx: No oropharyngeal exudate.  Eyes:     General: No scleral icterus.       Right eye: No discharge.        Left eye: No discharge.     Conjunctiva/sclera: Conjunctivae normal.     Pupils: Pupils are equal, round, and reactive to light.  Neck:     Thyroid: No thyromegaly.     Vascular: No JVD.     Trachea: No tracheal deviation.  Cardiovascular:     Rate and Rhythm: Normal rate and regular rhythm.     Heart sounds: Normal heart sounds. No murmur heard.    No friction rub. No gallop.  Pulmonary:     Effort: Pulmonary effort is normal. No respiratory distress.     Breath sounds: No stridor. Wheezing present. No rales.  Chest:     Chest wall: No tenderness.  Abdominal:     General: Bowel sounds are normal. There is no distension.     Palpations: Abdomen is soft. There is no mass.     Tenderness: There is no abdominal tenderness. There is no guarding or rebound.  Musculoskeletal:     Cervical back: Normal range of motion and neck supple.  Lymphadenopathy:     Cervical: No cervical adenopathy.     Right cervical: No deep or posterior cervical adenopathy.    Left cervical: No deep or posterior cervical adenopathy.     Upper Body:     Right upper body: No supraclavicular or axillary adenopathy.     Left upper body: No supraclavicular or axillary adenopathy.     Lower Body: No right inguinal adenopathy. No left inguinal adenopathy.  Skin:    General: Skin is warm.     Coloration: Skin is not pale.     Findings: No erythema or rash.  Neurological:     Mental Status: She is alert and oriented to person, place, and time.     Cranial Nerves: No cranial nerve deficit.     Motor: No abnormal muscle tone.     Coordination: Coordination normal.     Deep Tendon Reflexes: Reflexes are normal and symmetric.  Psychiatric:        Behavior: Behavior normal.        Thought Content: Thought content normal.         Judgment: Judgment normal.           Assessment & Plan:  Bronchitis I believe the patient is  dealing with bronchitis after COVID.  I will try the patient on a prednisone taper pack for possible reactive airway disease and bronchospasm to see if this will help with the cough and wheezing.

## 2023-09-21 ENCOUNTER — Other Ambulatory Visit: Payer: Self-pay

## 2023-09-21 MED ORDER — AMLODIPINE BESYLATE 5 MG PO TABS: 5.0000 mg | ORAL_TABLET | Freq: Every day | ORAL | 0 refills | Status: DC

## 2023-09-21 NOTE — Telephone Encounter (Signed)
Prescription Request  09/21/2023  LOV: 09/13/23  What is the name of the medication or equipment? amLODipine (NORVASC) 5 MG tablet  Have you contacted your pharmacy to request a refill? Yes   Which pharmacy would you like this sent to?  [id number] [place], [place] - [address] [place] [address] Phone: [phone] Fax: [phone]    Patient notified that their request is being sent to the clinical staff for review and that they should receive a response within 2 business days.   Please advise at [facility] [phone]

## 2023-09-21 NOTE — Telephone Encounter (Signed)
Requested Prescriptions  Pending Prescriptions Disp Refills   amLODipine (NORVASC) 5 MG tablet 90 tablet 0    Sig: Take 1 tablet (5 mg total) by mouth daily.     Cardiovascular: Calcium Channel Blockers 2 Failed - 09/21/2023  9:33 AM      Failed - Valid encounter within last 6 months    Recent Outpatient Visits           2 years ago Close exposure to COVID-19 virus   [facility] Pickard, Priscille Heidelberg, MD   2 years ago Routine general medical examination at a health care facility   [facility] Pickard, Priscille Heidelberg, MD   3 years ago COVID-19   [facility] Tanya Nones, Priscille Heidelberg, MD   3 years ago Fever, unspecified fever cause   [facility] Tanya Nones, Priscille Heidelberg, MD   4 years ago Routine general medical examination at a health care facility   [facility] Pickard, Priscille Heidelberg, MD              Passed - Last BP in normal range    BP Readings from Last 1 Encounters:  09/13/23 132/82         Passed - Last Heart Rate in normal range    Pulse Readings from Last 1 Encounters:  09/13/23 84

## 2023-10-05 DIAGNOSIS — L821 Other seborrheic keratosis: Secondary | ICD-10-CM | POA: Diagnosis not present

## 2023-10-05 DIAGNOSIS — L814 Other melanin hyperpigmentation: Secondary | ICD-10-CM | POA: Diagnosis not present

## 2023-10-05 DIAGNOSIS — L988 Other specified disorders of the skin and subcutaneous tissue: Secondary | ICD-10-CM | POA: Diagnosis not present

## 2023-10-05 DIAGNOSIS — D225 Melanocytic nevi of trunk: Secondary | ICD-10-CM | POA: Diagnosis not present

## 2024-03-09 ENCOUNTER — Other Ambulatory Visit: Payer: Self-pay

## 2024-03-09 ENCOUNTER — Telehealth: Payer: Self-pay | Admitting: Family Medicine

## 2024-03-09 MED ORDER — AMLODIPINE BESYLATE 5 MG PO TABS: 5.0000 mg | ORAL_TABLET | Freq: Every day | ORAL | 0 refills | Status: DC

## 2024-03-09 NOTE — Telephone Encounter (Signed)
 Prescription Request  03/09/2024  LOV: 09/13/2023  What is the name of the medication or equipment?   amLODipine (NORVASC) 5 MG tablet [[id number]]   Have you contacted your pharmacy to request a refill? Yes   Which pharmacy would you like this sent to?  [id number] [place], [place] - [address] [place] [address] Phone: [phone] Fax: [phone]    Patient notified that their request is being sent to the clinical staff for review and that they should receive a response within 2 business days.   Please advise pharmacist.

## 2024-04-11 ENCOUNTER — Encounter: Payer: Self-pay | Admitting: Family Medicine

## 2024-04-11 ENCOUNTER — Other Ambulatory Visit: Payer: Self-pay | Admitting: Family Medicine

## 2024-04-11 ENCOUNTER — Ambulatory Visit (INDEPENDENT_AMBULATORY_CARE_PROVIDER_SITE_OTHER): Admitting: Family Medicine

## 2024-04-11 VITALS — BP 120/68 | HR 70 | Temp 98.0°F | Ht 65.0 in | Wt 128.4 lb

## 2024-04-11 DIAGNOSIS — Z0000 Encounter for general adult medical examination without abnormal findings: Secondary | ICD-10-CM

## 2024-04-11 DIAGNOSIS — I10 Essential (primary) hypertension: Secondary | ICD-10-CM | POA: Diagnosis not present

## 2024-04-11 DIAGNOSIS — Z0001 Encounter for general adult medical examination with abnormal findings: Secondary | ICD-10-CM | POA: Diagnosis not present

## 2024-04-11 DIAGNOSIS — E7800 Pure hypercholesterolemia, unspecified: Secondary | ICD-10-CM | POA: Diagnosis not present

## 2024-04-11 MED ORDER — HYDROCHLOROTHIAZIDE 12.5 MG PO CAPS
ORAL_CAPSULE | ORAL | 3 refills | Status: AC
Start: 2024-04-11 — End: ?

## 2024-04-11 MED ORDER — AMLODIPINE BESYLATE 5 MG PO TABS: 5.0000 mg | ORAL_TABLET | Freq: Every day | ORAL | 3 refills | Status: AC

## 2024-04-11 MED ORDER — ROSUVASTATIN CALCIUM 10 MG PO TABS: 10.0000 mg | ORAL_TABLET | Freq: Every day | ORAL | 3 refills | Status: AC

## 2024-04-11 NOTE — Progress Notes (Signed)
 Subjective:    Patient ID: Melissa Spencer, female    DOB: 1966-09-20, 58 y.o.   MRN: [id number]  HPI Patient is here today for complete physical exam.  Her mammogram and Pap smear is performed by her gynecologist.  She is doing this later this year.  She had a Cologuard performed in 2023.  Is due again in 2026.  She is not yet due for bone density test.  She is due for the shingles vaccine, the pneumonia shot, and tetanus shot.  She declines this today.  Her blood pressure is excellent.  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 No current outpatient medications on file prior to visit.   No current facility-administered medications on file prior to visit.   No Known Allergies Social History   Socioeconomic History   Marital status: Divorced    Spouse name: Not on file   Number of children: 1   Years of education: 12   Highest education level: 12th grade  Occupational History   Not on file  Tobacco Use   Smoking status: Former    Passive exposure: Past   Smokeless tobacco: Never  Vaping Use   Vaping status: Never Used  Substance and Sexual Activity   Alcohol use: Yes    Comment: Occasional   Drug use: No   Sexual activity: Yes  Other Topics Concern   Not on file  Social History Narrative   Not on file   Social Drivers of Health   Financial Resource Strain: Low Risk  (04/09/2024)   Overall Financial Resource Strain (CARDIA)    Difficulty of Paying Living Expenses: Not hard at all  Food Insecurity: No Food Insecurity (04/09/2024)   Hunger Vital Sign    Worried About Running Out of Food in the Last Year: Never true    Ran Out of Food in the Last Year: Never true  Transportation Needs: No Transportation Needs (04/09/2024)   PRAPARE - Administrator, Civil Service (Medical): No    Lack of Transportation (Non-Medical): No  Physical  Activity: Sufficiently Active (04/09/2024)   Exercise Vital Sign    Days of Exercise per Week: 7 days    Minutes of Exercise per Session: 40 min  Stress: No Stress Concern Present (04/09/2024)   [employer] of Occupational Health - Occupational Stress Questionnaire    Feeling of Stress : Only a little  Social Connections: Unknown (04/09/2024)   Social Connection and Isolation Panel [NHANES]    Frequency of Communication with Friends and Family: More than three times a week    Frequency of Social Gatherings with Friends and Family: Once a week    Attends Religious Services: Patient declined    Database administrator or Organizations: No    Attends Engineer, structural: More than 4 times per year    Marital Status: Divorced  Catering manager Violence: Not At Risk (04/29/2023)   Humiliation, Afraid, Rape, and Kick questionnaire    Fear of Current or Ex-Partner: No    Emotionally Abused: No    Physically Abused: No    Sexually Abused: No   Family History  Problem Relation Age of Onset   Hyperlipidemia Mother    Hypertension Mother    Hyperlipidemia Father    Heart disease Father       Review of  Systems  All other systems reviewed and are negative.      Objective:   Physical Exam Vitals reviewed.  Constitutional:      General: She is not in acute distress.    Appearance: She is well-developed. She is not diaphoretic.  HENT:     Head: Normocephalic and atraumatic.     Right Ear: External ear normal.     Left Ear: External ear normal.     Nose: Nose normal.     Mouth/Throat:     Pharynx: No oropharyngeal exudate.  Eyes:     General: No scleral icterus.       Right eye: No discharge.        Left eye: No discharge.     Conjunctiva/sclera: Conjunctivae normal.     Pupils: Pupils are equal, round, and reactive to light.  Neck:     Thyroid: No thyromegaly.     Vascular: No JVD.     Trachea: No tracheal deviation.  Cardiovascular:     Rate and Rhythm: Normal  rate and regular rhythm.     Heart sounds: Normal heart sounds. No murmur heard.    No friction rub. No gallop.  Pulmonary:     Effort: Pulmonary effort is normal. No respiratory distress.     Breath sounds: Normal breath sounds. No stridor. No wheezing or rales.  Chest:     Chest wall: No tenderness.  Abdominal:     General: Bowel sounds are normal. There is no distension.     Palpations: Abdomen is soft. There is no mass.     Tenderness: There is no abdominal tenderness. There is no guarding or rebound.  Musculoskeletal:        General: No tenderness. Normal range of motion.     Cervical back: Normal range of motion and neck supple.  Lymphadenopathy:     Cervical: No cervical adenopathy.  Skin:    General: Skin is warm.     Coloration: Skin is not pale.     Findings: No erythema or rash.  Neurological:     Mental Status: She is alert and oriented to person, place, and time.     Cranial Nerves: No cranial nerve deficit.     Motor: No abnormal muscle tone.     Coordination: Coordination normal.     Deep Tendon Reflexes: Reflexes are normal and symmetric.  Psychiatric:        Behavior: Behavior normal.        Thought Content: Thought content normal.        Judgment: Judgment normal.           Assessment & Plan:  Pure hypercholesterolemia  Benign essential HTN - Plan: hydrochlorothiazide  (MICROZIDE ) 12.5 MG capsule, CBC with Differential/Platelet, COMPLETE METABOLIC PANEL WITHOUT GFR, Lipid panel  General medical exam Very happy with her blood pressure today.  Check CBC, CMP, and lipid panel.  Goal LDL cholesterol is less than 100.  Cologuard is due next year.  Due for mammogram and Pap smear and her GYN.  Recommended shingles vaccine and pneumonia vaccine which she politely deferred

## 2024-04-12 LAB — CBC WITH DIFFERENTIAL/PLATELET
Absolute Lymphocytes: 2020 {cells}/uL (ref 850–3900)
Absolute Monocytes: 488 {cells}/uL (ref 200–950)
Basophils Absolute: 33 {cells}/uL (ref 0–200)
Basophils Relative: 0.5 %
Eosinophils Absolute: 92 {cells}/uL (ref 15–500)
Eosinophils Relative: 1.4 %
HCT: 39.8 % (ref 35.0–45.0)
Hemoglobin: 13.1 g/dL (ref 11.7–15.5)
MCH: 30.3 pg (ref 27.0–33.0)
MCHC: 32.9 g/dL (ref 32.0–36.0)
MCV: 91.9 fL (ref 80.0–100.0)
MPV: 10.3 fL (ref 7.5–12.5)
Monocytes Relative: 7.4 %
Neutro Abs: 3967 {cells}/uL (ref 1500–7800)
Neutrophils Relative %: 60.1 %
Platelets: 343 10*3/uL (ref 140–400)
RBC: 4.33 10*6/uL (ref 3.80–5.10)
RDW: 11.9 % (ref 11.0–15.0)
Total Lymphocyte: 30.6 %
WBC: 6.6 10*3/uL (ref 3.8–10.8)

## 2024-04-12 LAB — COMPLETE METABOLIC PANEL WITHOUT GFR
AG Ratio: 2.1 (calc) (ref 1.0–2.5)
ALT: 14 U/L (ref 6–29)
AST: 16 U/L (ref 10–35)
Albumin: 5 g/dL (ref 3.6–5.1)
Alkaline phosphatase (APISO): 70 U/L (ref 37–153)
BUN: 13 mg/dL (ref 7–25)
CO2: 28 mmol/L (ref 20–32)
Calcium: 10.3 mg/dL (ref 8.6–10.4)
Chloride: 103 mmol/L (ref 98–110)
Creat: 0.7 mg/dL (ref 0.50–1.03)
Globulin: 2.4 g/dL (ref 1.9–3.7)
Glucose, Bld: 119 mg/dL — ABNORMAL HIGH (ref 65–99)
Potassium: 4.8 mmol/L (ref 3.5–5.3)
Sodium: 140 mmol/L (ref 135–146)
Total Bilirubin: 0.3 mg/dL (ref 0.2–1.2)
Total Protein: 7.4 g/dL (ref 6.1–8.1)

## 2024-04-12 LAB — LIPID PANEL
Cholesterol: 156 mg/dL (ref <200)
HDL: 84 mg/dL (ref >=50)
LDL Cholesterol (Calc): 54 mg/dL
Non-HDL Cholesterol (Calc): 72 mg/dL (ref <130)
Total CHOL/HDL Ratio: 1.9 (calc) (ref <5.0)
Triglycerides: 95 mg/dL (ref <150)

## 2024-06-27 DIAGNOSIS — Z1231 Encounter for screening mammogram for malignant neoplasm of breast: Secondary | ICD-10-CM | POA: Diagnosis not present

## 2024-06-27 DIAGNOSIS — Z01419 Encounter for gynecological examination (general) (routine) without abnormal findings: Secondary | ICD-10-CM | POA: Diagnosis not present
